# Patient Record
Sex: Male | Born: 1935 | Race: White | Hispanic: No | Smoking: Former smoker
Health system: Southern US, Community
[De-identification: ages and names within clinical notes are randomized; demographics above are authoritative.]

## PROBLEM LIST (undated history)

## (undated) DIAGNOSIS — I10 Essential (primary) hypertension: Secondary | ICD-10-CM

## (undated) DIAGNOSIS — I499 Cardiac arrhythmia, unspecified: Secondary | ICD-10-CM

## (undated) DIAGNOSIS — J439 Emphysema, unspecified: Secondary | ICD-10-CM

## (undated) DIAGNOSIS — N189 Chronic kidney disease, unspecified: Secondary | ICD-10-CM

## (undated) DIAGNOSIS — E785 Hyperlipidemia, unspecified: Secondary | ICD-10-CM

## (undated) DIAGNOSIS — I509 Heart failure, unspecified: Secondary | ICD-10-CM

## (undated) DIAGNOSIS — Z8614 Personal history of Methicillin resistant Staphylococcus aureus infection: Secondary | ICD-10-CM

## (undated) DIAGNOSIS — D696 Thrombocytopenia, unspecified: Secondary | ICD-10-CM

## (undated) DIAGNOSIS — M109 Gout, unspecified: Secondary | ICD-10-CM

## (undated) DIAGNOSIS — I719 Aortic aneurysm of unspecified site, without rupture: Secondary | ICD-10-CM

## (undated) DIAGNOSIS — I2510 Atherosclerotic heart disease of native coronary artery without angina pectoris: Secondary | ICD-10-CM

## (undated) HISTORY — PX: CAROTID ENDARTERECTOMY: SUR193

## (undated) HISTORY — DX: Gout, unspecified: M10.9

## (undated) HISTORY — DX: Personal history of Methicillin resistant Staphylococcus aureus infection: Z86.14

## (undated) HISTORY — DX: Aortic aneurysm of unspecified site, without rupture: I71.9

## (undated) HISTORY — DX: Cardiac arrhythmia, unspecified: I49.9

## (undated) HISTORY — DX: Emphysema, unspecified: J43.9

## (undated) HISTORY — PX: APPENDECTOMY: SHX54

## (undated) HISTORY — PX: HERNIA REPAIR: SHX51

## (undated) HISTORY — DX: Essential (primary) hypertension: I10

## (undated) HISTORY — DX: Thrombocytopenia, unspecified: D69.6

## (undated) HISTORY — DX: Hyperlipidemia, unspecified: E78.5

## (undated) HISTORY — DX: Chronic kidney disease, unspecified: N18.9

## (undated) HISTORY — DX: Atherosclerotic heart disease of native coronary artery without angina pectoris: I25.10

## (undated) HISTORY — PX: ABDOMINAL AORTIC ANEURYSM REPAIR: SUR1152

## (undated) HISTORY — PX: CORONARY ARTERY BYPASS GRAFT: SHX141

---

## 2003-09-19 ENCOUNTER — Other Ambulatory Visit: Payer: Self-pay

## 2003-12-23 ENCOUNTER — Other Ambulatory Visit: Payer: Self-pay

## 2003-12-25 ENCOUNTER — Other Ambulatory Visit: Payer: Self-pay

## 2006-02-20 ENCOUNTER — Emergency Department: Payer: Self-pay | Admitting: Emergency Medicine

## 2006-04-30 ENCOUNTER — Emergency Department: Payer: Self-pay | Admitting: Internal Medicine

## 2007-04-20 ENCOUNTER — Emergency Department: Payer: Self-pay | Admitting: Emergency Medicine

## 2007-09-07 ENCOUNTER — Ambulatory Visit: Payer: Self-pay | Admitting: Internal Medicine

## 2007-09-10 ENCOUNTER — Other Ambulatory Visit: Payer: Self-pay

## 2007-09-10 ENCOUNTER — Emergency Department: Payer: Self-pay | Admitting: Emergency Medicine

## 2007-09-11 ENCOUNTER — Emergency Department: Payer: Self-pay | Admitting: Internal Medicine

## 2008-01-22 ENCOUNTER — Other Ambulatory Visit: Payer: Self-pay

## 2008-01-22 ENCOUNTER — Inpatient Hospital Stay: Payer: Self-pay | Admitting: Internal Medicine

## 2008-07-06 ENCOUNTER — Ambulatory Visit: Payer: Self-pay | Admitting: Oncology

## 2008-07-22 ENCOUNTER — Inpatient Hospital Stay: Payer: Self-pay | Admitting: Internal Medicine

## 2008-08-06 ENCOUNTER — Ambulatory Visit: Payer: Self-pay | Admitting: Oncology

## 2008-08-07 ENCOUNTER — Inpatient Hospital Stay: Payer: Self-pay | Admitting: Internal Medicine

## 2008-10-04 ENCOUNTER — Ambulatory Visit: Payer: Self-pay | Admitting: Family Medicine

## 2008-10-06 ENCOUNTER — Ambulatory Visit: Payer: Self-pay | Admitting: Internal Medicine

## 2008-10-08 ENCOUNTER — Ambulatory Visit: Payer: Self-pay | Admitting: Internal Medicine

## 2008-10-10 ENCOUNTER — Ambulatory Visit: Payer: Self-pay | Admitting: Internal Medicine

## 2008-10-18 ENCOUNTER — Encounter: Payer: Self-pay | Admitting: Internal Medicine

## 2008-10-18 ENCOUNTER — Inpatient Hospital Stay: Payer: Self-pay | Admitting: Orthopedic Surgery

## 2008-12-25 ENCOUNTER — Ambulatory Visit: Payer: Self-pay | Admitting: Family Medicine

## 2009-07-25 ENCOUNTER — Ambulatory Visit: Payer: Self-pay | Admitting: Otolaryngology

## 2009-08-06 ENCOUNTER — Emergency Department: Payer: Self-pay | Admitting: Emergency Medicine

## 2009-08-11 ENCOUNTER — Emergency Department: Payer: Self-pay | Admitting: Internal Medicine

## 2009-08-21 ENCOUNTER — Inpatient Hospital Stay: Payer: Self-pay | Admitting: Internal Medicine

## 2009-12-29 ENCOUNTER — Emergency Department: Payer: Self-pay | Admitting: Emergency Medicine

## 2011-03-28 ENCOUNTER — Inpatient Hospital Stay: Payer: Self-pay | Admitting: Internal Medicine

## 2011-08-23 ENCOUNTER — Inpatient Hospital Stay: Payer: Self-pay | Admitting: Internal Medicine

## 2011-08-23 LAB — COMPREHENSIVE METABOLIC PANEL
Anion Gap: 14 (ref 7–16)
BUN: 40 mg/dL — ABNORMAL HIGH (ref 7–18)
Bilirubin,Total: 0.8 mg/dL (ref 0.2–1.0)
Chloride: 104 mmol/L (ref 98–107)
Co2: 23 mmol/L (ref 21–32)
Creatinine: 2.3 mg/dL — ABNORMAL HIGH (ref 0.60–1.30)
EGFR (African American): 35 — ABNORMAL LOW
EGFR (Non-African Amer.): 29 — ABNORMAL LOW
Osmolality: 293 (ref 275–301)
Potassium: 4.3 mmol/L (ref 3.5–5.1)
SGPT (ALT): 22 U/L
Total Protein: 7.8 g/dL (ref 6.4–8.2)

## 2011-08-23 LAB — CBC
HCT: 43.3 % (ref 40.0–52.0)
HGB: 14.1 g/dL (ref 13.0–18.0)
MCH: 29.7 pg (ref 26.0–34.0)
MCHC: 32.5 g/dL (ref 32.0–36.0)
Platelet: 109 10*3/uL — ABNORMAL LOW (ref 150–440)
RBC: 4.74 10*6/uL (ref 4.40–5.90)
WBC: 5.9 10*3/uL (ref 3.8–10.6)

## 2011-08-23 LAB — TROPONIN I
Troponin-I: 0.05 ng/mL
Troponin-I: 0.13 ng/mL — ABNORMAL HIGH

## 2011-08-23 LAB — CK TOTAL AND CKMB (NOT AT ARMC)
CK, Total: 75 U/L (ref 35–232)
CK-MB: 1.2 ng/mL (ref 0.5–3.6)

## 2011-08-23 LAB — PROTIME-INR
INR: 2.7
Prothrombin Time: 29.2 secs — ABNORMAL HIGH (ref 11.5–14.7)

## 2011-08-24 LAB — CBC WITH DIFFERENTIAL/PLATELET
Basophil #: 0 10*3/uL (ref 0.0–0.1)
Basophil %: 0.2 %
Eosinophil #: 0.1 10*3/uL (ref 0.0–0.7)
Eosinophil %: 0.7 %
HCT: 35.9 % — ABNORMAL LOW (ref 40.0–52.0)
HGB: 11.7 g/dL — ABNORMAL LOW (ref 13.0–18.0)
Lymphocyte %: 10 %
MCH: 30 pg (ref 26.0–34.0)
MCHC: 32.6 g/dL (ref 32.0–36.0)
Monocyte #: 0.8 10*3/uL — ABNORMAL HIGH (ref 0.0–0.7)
Neutrophil #: 10.7 10*3/uL — ABNORMAL HIGH (ref 1.4–6.5)
RDW: 17.3 % — ABNORMAL HIGH (ref 11.5–14.5)

## 2011-08-24 LAB — PROTIME-INR: INR: 2.7

## 2011-08-24 LAB — BASIC METABOLIC PANEL
Anion Gap: 13 (ref 7–16)
BUN: 46 mg/dL — ABNORMAL HIGH (ref 7–18)
Chloride: 97 mmol/L — ABNORMAL LOW (ref 98–107)
Co2: 27 mmol/L (ref 21–32)
EGFR (Non-African Amer.): 23 — ABNORMAL LOW
Potassium: 3.9 mmol/L (ref 3.5–5.1)

## 2011-08-24 LAB — MAGNESIUM: Magnesium: 1.8 mg/dL

## 2011-08-24 LAB — CK TOTAL AND CKMB (NOT AT ARMC): CK, Total: 60 U/L (ref 35–232)

## 2011-08-25 LAB — CBC WITH DIFFERENTIAL/PLATELET
Basophil #: 0 10*3/uL (ref 0.0–0.1)
Basophil %: 0.1 %
Eosinophil #: 0.2 10*3/uL (ref 0.0–0.7)
Eosinophil %: 1.7 %
HCT: 36.7 % — ABNORMAL LOW (ref 40.0–52.0)
HGB: 11.8 g/dL — ABNORMAL LOW (ref 13.0–18.0)
Lymphocyte #: 1.1 10*3/uL (ref 1.0–3.6)
Lymphocyte %: 10.6 %
MCH: 29.7 pg (ref 26.0–34.0)
MCHC: 32.3 g/dL (ref 32.0–36.0)
MCV: 92 fL (ref 80–100)
Monocyte #: 0.7 10*3/uL (ref 0.0–0.7)
Monocyte %: 6.6 %
Neutrophil #: 8.3 10*3/uL — ABNORMAL HIGH (ref 1.4–6.5)
Neutrophil %: 81 %
RBC: 3.99 10*6/uL — ABNORMAL LOW (ref 4.40–5.90)
WBC: 10.3 10*3/uL (ref 3.8–10.6)

## 2011-08-25 LAB — BASIC METABOLIC PANEL
Anion Gap: 10 (ref 7–16)
BUN: 52 mg/dL — ABNORMAL HIGH (ref 7–18)
Chloride: 95 mmol/L — ABNORMAL LOW (ref 98–107)
Co2: 29 mmol/L (ref 21–32)
Creatinine: 2.76 mg/dL — ABNORMAL HIGH (ref 0.60–1.30)
Potassium: 3.6 mmol/L (ref 3.5–5.1)
Sodium: 134 mmol/L — ABNORMAL LOW (ref 136–145)

## 2011-08-26 LAB — CBC WITH DIFFERENTIAL/PLATELET
Basophil %: 0.2 %
Eosinophil #: 0.2 10*3/uL (ref 0.0–0.7)
Eosinophil %: 2 %
HCT: 37 % — ABNORMAL LOW (ref 40.0–52.0)
HGB: 12.1 g/dL — ABNORMAL LOW (ref 13.0–18.0)
Lymphocyte #: 1 10*3/uL (ref 1.0–3.6)
Lymphocyte %: 12 %
MCH: 29.5 pg (ref 26.0–34.0)
MCV: 91 fL (ref 80–100)
Neutrophil #: 6.5 10*3/uL (ref 1.4–6.5)
RBC: 4.09 10*6/uL — ABNORMAL LOW (ref 4.40–5.90)
RDW: 16.8 % — ABNORMAL HIGH (ref 11.5–14.5)
WBC: 8.6 10*3/uL (ref 3.8–10.6)

## 2011-08-26 LAB — BASIC METABOLIC PANEL
Anion Gap: 11 (ref 7–16)
Calcium, Total: 9 mg/dL (ref 8.5–10.1)
Chloride: 94 mmol/L — ABNORMAL LOW (ref 98–107)
Co2: 28 mmol/L (ref 21–32)
Osmolality: 282 (ref 275–301)

## 2011-08-27 LAB — BASIC METABOLIC PANEL
Anion Gap: 11 (ref 7–16)
BUN: 52 mg/dL — ABNORMAL HIGH (ref 7–18)
Calcium, Total: 9.3 mg/dL (ref 8.5–10.1)
Chloride: 94 mmol/L — ABNORMAL LOW (ref 98–107)
Co2: 29 mmol/L (ref 21–32)
Creatinine: 2.54 mg/dL — ABNORMAL HIGH (ref 0.60–1.30)
Creatinine: 2.45 mg/dL — ABNORMAL HIGH (ref 0.60–1.30)
EGFR (African American): 31 — ABNORMAL LOW
EGFR (African American): 33 — ABNORMAL LOW
EGFR (Non-African Amer.): 26 — ABNORMAL LOW
Glucose: 111 mg/dL — ABNORMAL HIGH (ref 65–99)
Osmolality: 277 (ref 275–301)
Osmolality: 283 (ref 275–301)
Potassium: 3.2 mmol/L — ABNORMAL LOW (ref 3.5–5.1)
Potassium: 3.7 mmol/L (ref 3.5–5.1)
Sodium: 131 mmol/L — ABNORMAL LOW (ref 136–145)

## 2011-08-27 LAB — PROTIME-INR
INR: 3.2
Prothrombin Time: 32.9 secs — ABNORMAL HIGH (ref 11.5–14.7)

## 2011-08-27 LAB — MAGNESIUM
Magnesium: 2.3 mg/dL
Magnesium: 2.3 mg/dL

## 2011-08-27 LAB — HEMOGLOBIN: HGB: 13.1 g/dL (ref 13.0–18.0)

## 2011-08-28 LAB — COMPREHENSIVE METABOLIC PANEL
Albumin: 2.4 g/dL — ABNORMAL LOW (ref 3.4–5.0)
Alkaline Phosphatase: 65 U/L (ref 50–136)
Anion Gap: 13 (ref 7–16)
BUN: 44 mg/dL — ABNORMAL HIGH (ref 7–18)
Bilirubin,Total: 0.9 mg/dL (ref 0.2–1.0)
Calcium, Total: 8.1 mg/dL — ABNORMAL LOW (ref 8.5–10.1)
Chloride: 94 mmol/L — ABNORMAL LOW (ref 98–107)
Co2: 31 mmol/L (ref 21–32)
Creatinine: 2.61 mg/dL — ABNORMAL HIGH (ref 0.60–1.30)
Glucose: 156 mg/dL — ABNORMAL HIGH (ref 65–99)
Osmolality: 290 (ref 275–301)
SGOT(AST): 18 U/L (ref 15–37)
SGPT (ALT): 8 U/L — ABNORMAL LOW
Sodium: 138 mmol/L (ref 136–145)
Total Protein: 6.6 g/dL (ref 6.4–8.2)

## 2011-08-28 LAB — CBC WITH DIFFERENTIAL/PLATELET
Basophil %: 0 %
Lymphocyte #: 0.6 10*3/uL — ABNORMAL LOW (ref 1.0–3.6)
Lymphocyte %: 5.3 %
MCHC: 32.9 g/dL (ref 32.0–36.0)
Monocyte #: 0.8 10*3/uL — ABNORMAL HIGH (ref 0.0–0.7)
Monocyte %: 7.5 %
Neutrophil #: 9.4 10*3/uL — ABNORMAL HIGH (ref 1.4–6.5)
Neutrophil %: 87.1 %
Platelet: 108 10*3/uL — ABNORMAL LOW (ref 150–440)
RBC: 3.72 10*6/uL — ABNORMAL LOW (ref 4.40–5.90)
RDW: 16.2 % — ABNORMAL HIGH (ref 11.5–14.5)

## 2011-08-28 LAB — PROTIME-INR
INR: 4.3
INR: 4.7
Prothrombin Time: 43.8 secs — ABNORMAL HIGH (ref 11.5–14.7)

## 2011-08-28 LAB — CULTURE, BLOOD (SINGLE)

## 2011-08-28 LAB — PHOSPHORUS: Phosphorus: 2.8 mg/dL (ref 2.5–4.9)

## 2011-08-28 LAB — APTT: Activated PTT: 86.1 secs — ABNORMAL HIGH (ref 23.6–35.9)

## 2011-08-29 LAB — BASIC METABOLIC PANEL
Anion Gap: 12 (ref 7–16)
Chloride: 97 mmol/L — ABNORMAL LOW (ref 98–107)
Co2: 26 mmol/L (ref 21–32)
Creatinine: 2.21 mg/dL — ABNORMAL HIGH (ref 0.60–1.30)
EGFR (African American): 37 — ABNORMAL LOW
Glucose: 113 mg/dL — ABNORMAL HIGH (ref 65–99)
Osmolality: 281 (ref 275–301)
Potassium: 3.9 mmol/L (ref 3.5–5.1)
Sodium: 135 mmol/L — ABNORMAL LOW (ref 136–145)

## 2011-08-29 LAB — HEMOGLOBIN: HGB: 10.4 g/dL — ABNORMAL LOW (ref 13.0–18.0)

## 2011-08-30 LAB — BASIC METABOLIC PANEL
BUN: 47 mg/dL — ABNORMAL HIGH (ref 7–18)
Chloride: 97 mmol/L — ABNORMAL LOW (ref 98–107)
Creatinine: 2.4 mg/dL — ABNORMAL HIGH (ref 0.60–1.30)
EGFR (African American): 33 — ABNORMAL LOW
EGFR (Non-African Amer.): 27 — ABNORMAL LOW
Glucose: 101 mg/dL — ABNORMAL HIGH (ref 65–99)
Osmolality: 281 (ref 275–301)
Potassium: 3.9 mmol/L (ref 3.5–5.1)
Sodium: 134 mmol/L — ABNORMAL LOW (ref 136–145)

## 2011-08-30 LAB — MAGNESIUM: Magnesium: 2.3 mg/dL

## 2011-08-30 LAB — PROTIME-INR
INR: 3.5
Prothrombin Time: 35.3 secs — ABNORMAL HIGH (ref 11.5–14.7)

## 2011-08-31 LAB — SYNOVIAL CELL COUNT + DIFF, W/ CRYSTALS
Basophil: 0 %
Crystals, Joint Fluid: NONE SEEN
Lymphocytes: 0 %
Neutrophils: 73 %
Nucleated Cell Count: 10980 /mm3
Nucleated Cell Count: 1620 /mm3
Other Cells BF: 0 %
Other Mononuclear Cells: 4 %

## 2011-08-31 LAB — PROTIME-INR
INR: 3.1
Prothrombin Time: 31.8 secs — ABNORMAL HIGH (ref 11.5–14.7)

## 2011-08-31 LAB — BASIC METABOLIC PANEL
Anion Gap: 14 (ref 7–16)
Calcium, Total: 8.5 mg/dL (ref 8.5–10.1)
Co2: 23 mmol/L (ref 21–32)
EGFR (African American): 37 — ABNORMAL LOW
Osmolality: 281 (ref 275–301)

## 2011-08-31 LAB — HEMOGLOBIN: HGB: 9.5 g/dL — ABNORMAL LOW (ref 13.0–18.0)

## 2011-08-31 LAB — MAGNESIUM: Magnesium: 2.4 mg/dL

## 2011-09-01 LAB — PROTIME-INR: Prothrombin Time: 28 secs — ABNORMAL HIGH (ref 11.5–14.7)

## 2011-09-01 LAB — HEMOGLOBIN: HGB: 9.8 g/dL — ABNORMAL LOW (ref 13.0–18.0)

## 2011-12-30 ENCOUNTER — Ambulatory Visit: Payer: Self-pay | Admitting: Vascular Surgery

## 2011-12-30 LAB — CREATININE, SERUM
Creatinine: 3.55 mg/dL — ABNORMAL HIGH (ref 0.60–1.30)
EGFR (African American): 17 — ABNORMAL LOW
EGFR (Non-African Amer.): 15 — ABNORMAL LOW

## 2012-01-04 ENCOUNTER — Ambulatory Visit: Payer: Self-pay | Admitting: Vascular Surgery

## 2012-01-04 LAB — CREATININE, SERUM
Creatinine: 3.37 mg/dL — ABNORMAL HIGH (ref 0.60–1.30)
EGFR (Non-African Amer.): 16 — ABNORMAL LOW

## 2012-01-04 LAB — PROTIME-INR: INR: 2.6

## 2012-01-04 LAB — BUN: BUN: 72 mg/dL — ABNORMAL HIGH (ref 7–18)

## 2012-04-04 ENCOUNTER — Ambulatory Visit: Payer: Self-pay | Admitting: Vascular Surgery

## 2012-04-04 LAB — BASIC METABOLIC PANEL WITH GFR
Anion Gap: 6 — ABNORMAL LOW
BUN: 44 mg/dL — ABNORMAL HIGH
Calcium, Total: 9 mg/dL
Chloride: 104 mmol/L
Co2: 31 mmol/L
Creatinine: 2.42 mg/dL — ABNORMAL HIGH
EGFR (African American): 27 — ABNORMAL LOW
EGFR (Non-African Amer.): 23 — ABNORMAL LOW
Glucose: 102 mg/dL — ABNORMAL HIGH
Osmolality: 293
Potassium: 4.7 mmol/L
Sodium: 141 mmol/L

## 2012-04-04 LAB — CBC
HCT: 38.2 % — ABNORMAL LOW (ref 40.0–52.0)
HGB: 12.7 g/dL — ABNORMAL LOW (ref 13.0–18.0)
MCH: 31.3 pg (ref 26.0–34.0)
MCHC: 33.2 g/dL (ref 32.0–36.0)
RBC: 4.04 10*6/uL — ABNORMAL LOW (ref 4.40–5.90)
WBC: 7.4 10*3/uL (ref 3.8–10.6)

## 2012-04-07 ENCOUNTER — Inpatient Hospital Stay: Payer: Self-pay | Admitting: Vascular Surgery

## 2012-04-07 LAB — PROTIME-INR
INR: 1.5
Prothrombin Time: 18.8 secs — ABNORMAL HIGH (ref 11.5–14.7)

## 2012-04-08 LAB — CBC WITH DIFFERENTIAL/PLATELET
Basophil %: 0.5 %
Eosinophil %: 3.1 %
HCT: 36.2 % — ABNORMAL LOW (ref 40.0–52.0)
Lymphocyte %: 10.1 %
MCHC: 32.4 g/dL (ref 32.0–36.0)
Monocyte #: 0.7 x10 3/mm (ref 0.2–1.0)
Monocyte %: 12.1 %
Neutrophil #: 4.3 10*3/uL (ref 1.4–6.5)
Neutrophil %: 74.2 %
RBC: 3.84 10*6/uL — ABNORMAL LOW (ref 4.40–5.90)
RDW: 16.3 % — ABNORMAL HIGH (ref 11.5–14.5)
WBC: 5.8 10*3/uL (ref 3.8–10.6)

## 2012-04-08 LAB — BASIC METABOLIC PANEL
Anion Gap: 6 — ABNORMAL LOW (ref 7–16)
BUN: 36 mg/dL — ABNORMAL HIGH (ref 7–18)
Calcium, Total: 8.2 mg/dL — ABNORMAL LOW (ref 8.5–10.1)
Chloride: 105 mmol/L (ref 98–107)
EGFR (African American): 29 — ABNORMAL LOW
EGFR (Non-African Amer.): 25 — ABNORMAL LOW
Glucose: 97 mg/dL (ref 65–99)
Osmolality: 288 (ref 275–301)

## 2012-04-08 LAB — PROTIME-INR
INR: 1.5
Prothrombin Time: 18.4 secs — ABNORMAL HIGH (ref 11.5–14.7)

## 2012-04-08 LAB — APTT: Activated PTT: 41.1 secs — ABNORMAL HIGH (ref 23.6–35.9)

## 2013-03-12 ENCOUNTER — Emergency Department: Payer: Self-pay | Admitting: Emergency Medicine

## 2013-03-12 LAB — BASIC METABOLIC PANEL
Anion Gap: 3 — ABNORMAL LOW (ref 7–16)
BUN: 27 mg/dL — ABNORMAL HIGH (ref 7–18)
Calcium, Total: 8.9 mg/dL (ref 8.5–10.1)
Co2: 28 mmol/L (ref 21–32)
Creatinine: 2.2 mg/dL — ABNORMAL HIGH (ref 0.60–1.30)
EGFR (African American): 30 — ABNORMAL LOW
Glucose: 103 mg/dL — ABNORMAL HIGH (ref 65–99)
Osmolality: 281 (ref 275–301)

## 2013-03-12 LAB — CBC
HCT: 38 % — ABNORMAL LOW (ref 40.0–52.0)
HGB: 12.8 g/dL — ABNORMAL LOW (ref 13.0–18.0)
MCH: 30.7 pg (ref 26.0–34.0)
MCHC: 33.6 g/dL (ref 32.0–36.0)
MCV: 92 fL (ref 80–100)
Platelet: 98 10*3/uL — ABNORMAL LOW (ref 150–440)
RBC: 4.15 10*6/uL — ABNORMAL LOW (ref 4.40–5.90)
WBC: 6.7 10*3/uL (ref 3.8–10.6)

## 2013-03-12 LAB — TROPONIN I: Troponin-I: 0.07 ng/mL — ABNORMAL HIGH

## 2013-09-25 ENCOUNTER — Ambulatory Visit: Payer: Self-pay | Admitting: Pain Medicine

## 2013-10-03 ENCOUNTER — Ambulatory Visit: Payer: Self-pay | Admitting: Pain Medicine

## 2013-10-03 LAB — HEPATIC FUNCTION PANEL A (ARMC)
ALK PHOS: 120 U/L — AB
Albumin: 3.9 g/dL (ref 3.4–5.0)
BILIRUBIN TOTAL: 1 mg/dL (ref 0.2–1.0)
Bilirubin, Direct: 0.2 mg/dL (ref 0.00–0.20)
SGOT(AST): 20 U/L (ref 15–37)
SGPT (ALT): 15 U/L (ref 12–78)
TOTAL PROTEIN: 7.8 g/dL (ref 6.4–8.2)

## 2013-10-03 LAB — BASIC METABOLIC PANEL
Anion Gap: 3 — ABNORMAL LOW (ref 7–16)
BUN: 44 mg/dL — AB (ref 7–18)
CALCIUM: 8.7 mg/dL (ref 8.5–10.1)
CO2: 30 mmol/L (ref 21–32)
Chloride: 105 mmol/L (ref 98–107)
Creatinine: 3.07 mg/dL — ABNORMAL HIGH (ref 0.60–1.30)
EGFR (Non-African Amer.): 17 — ABNORMAL LOW
GFR CALC AF AMER: 20 — AB
GLUCOSE: 100 mg/dL — AB (ref 65–99)
Osmolality: 287 (ref 275–301)
POTASSIUM: 4.2 mmol/L (ref 3.5–5.1)
SODIUM: 138 mmol/L (ref 136–145)

## 2013-10-03 LAB — SEDIMENTATION RATE: ERYTHROCYTE SED RATE: 34 mm/h — AB (ref 0–20)

## 2013-10-17 ENCOUNTER — Ambulatory Visit: Payer: Self-pay | Admitting: Pain Medicine

## 2013-11-06 ENCOUNTER — Ambulatory Visit: Payer: Self-pay | Admitting: Pain Medicine

## 2013-11-16 ENCOUNTER — Ambulatory Visit: Payer: Self-pay | Admitting: Pain Medicine

## 2013-11-17 ENCOUNTER — Ambulatory Visit: Payer: Self-pay | Admitting: Pain Medicine

## 2013-12-11 ENCOUNTER — Ambulatory Visit: Payer: Self-pay | Admitting: Pain Medicine

## 2013-12-19 ENCOUNTER — Ambulatory Visit: Payer: Self-pay | Admitting: Pain Medicine

## 2014-01-03 ENCOUNTER — Ambulatory Visit: Payer: Self-pay | Admitting: Pain Medicine

## 2014-01-11 ENCOUNTER — Ambulatory Visit: Payer: Self-pay | Admitting: Pain Medicine

## 2014-01-18 ENCOUNTER — Ambulatory Visit: Payer: Self-pay | Admitting: Pain Medicine

## 2014-01-31 ENCOUNTER — Ambulatory Visit: Payer: Self-pay | Admitting: Pain Medicine

## 2014-02-06 ENCOUNTER — Ambulatory Visit: Payer: Self-pay | Admitting: Pain Medicine

## 2014-02-26 ENCOUNTER — Ambulatory Visit: Payer: Self-pay | Admitting: Pain Medicine

## 2014-02-28 ENCOUNTER — Other Ambulatory Visit: Payer: Self-pay | Admitting: Pain Medicine

## 2014-02-28 LAB — BASIC METABOLIC PANEL
ANION GAP: 5 — AB (ref 7–16)
BUN: 39 mg/dL — ABNORMAL HIGH (ref 7–18)
CO2: 29 mmol/L (ref 21–32)
Calcium, Total: 8.6 mg/dL (ref 8.5–10.1)
Chloride: 108 mmol/L — ABNORMAL HIGH (ref 98–107)
Creatinine: 2.44 mg/dL — ABNORMAL HIGH (ref 0.60–1.30)
GFR CALC AF AMER: 27 — AB
GFR CALC NON AF AMER: 23 — AB
GLUCOSE: 100 mg/dL — AB (ref 65–99)
Osmolality: 293 (ref 275–301)
Potassium: 4.3 mmol/L (ref 3.5–5.1)
Sodium: 142 mmol/L (ref 136–145)

## 2014-02-28 LAB — HEPATIC FUNCTION PANEL A (ARMC)
ALK PHOS: 106 U/L
ALT: 16 U/L
Albumin: 3.4 g/dL (ref 3.4–5.0)
Bilirubin, Direct: 0.1 mg/dL (ref 0.00–0.20)
Bilirubin,Total: 0.6 mg/dL (ref 0.2–1.0)
SGOT(AST): 14 U/L — ABNORMAL LOW (ref 15–37)
Total Protein: 7.5 g/dL (ref 6.4–8.2)

## 2014-03-07 ENCOUNTER — Ambulatory Visit: Payer: Self-pay | Admitting: Pain Medicine

## 2014-03-21 ENCOUNTER — Ambulatory Visit: Payer: Self-pay | Admitting: Pain Medicine

## 2014-03-21 ENCOUNTER — Other Ambulatory Visit: Payer: Self-pay | Admitting: Pain Medicine

## 2014-03-21 LAB — URINALYSIS, COMPLETE
Bilirubin,UR: NEGATIVE
GLUCOSE, UR: NEGATIVE mg/dL (ref 0–75)
Hyaline Cast: 3
KETONE: NEGATIVE
Nitrite: NEGATIVE
PH: 6 (ref 4.5–8.0)
PROTEIN: NEGATIVE
RBC,UR: 6 /HPF (ref 0–5)
SQUAMOUS EPITHELIAL: NONE SEEN
Specific Gravity: 1.006 (ref 1.003–1.030)
WBC UR: 443 /HPF (ref 0–5)

## 2014-03-28 ENCOUNTER — Ambulatory Visit: Payer: Self-pay | Admitting: Internal Medicine

## 2014-04-07 ENCOUNTER — Emergency Department: Payer: Self-pay | Admitting: Internal Medicine

## 2014-04-07 LAB — COMPREHENSIVE METABOLIC PANEL
ALK PHOS: 83 U/L
Albumin: 3.1 g/dL — ABNORMAL LOW (ref 3.4–5.0)
Anion Gap: 6 — ABNORMAL LOW (ref 7–16)
BILIRUBIN TOTAL: 0.6 mg/dL (ref 0.2–1.0)
BUN: 39 mg/dL — ABNORMAL HIGH (ref 7–18)
CALCIUM: 8.3 mg/dL — AB (ref 8.5–10.1)
Chloride: 107 mmol/L (ref 98–107)
Co2: 29 mmol/L (ref 21–32)
Creatinine: 2.46 mg/dL — ABNORMAL HIGH (ref 0.60–1.30)
EGFR (African American): 32 — ABNORMAL LOW
EGFR (Non-African Amer.): 27 — ABNORMAL LOW
Glucose: 104 mg/dL — ABNORMAL HIGH (ref 65–99)
OSMOLALITY: 293 (ref 275–301)
Potassium: 4.2 mmol/L (ref 3.5–5.1)
SGOT(AST): 20 U/L (ref 15–37)
SGPT (ALT): 16 U/L
Sodium: 142 mmol/L (ref 136–145)
Total Protein: 6.7 g/dL (ref 6.4–8.2)

## 2014-04-07 LAB — CBC
HCT: 29.2 % — ABNORMAL LOW (ref 40.0–52.0)
HGB: 8.9 g/dL — ABNORMAL LOW (ref 13.0–18.0)
MCH: 26.7 pg (ref 26.0–34.0)
MCHC: 30.3 g/dL — AB (ref 32.0–36.0)
MCV: 88 fL (ref 80–100)
PLATELETS: 111 10*3/uL — AB (ref 150–440)
RBC: 3.32 10*6/uL — ABNORMAL LOW (ref 4.40–5.90)
RDW: 17.7 % — ABNORMAL HIGH (ref 11.5–14.5)
WBC: 6 10*3/uL (ref 3.8–10.6)

## 2014-04-07 LAB — PROTIME-INR
INR: 2.3
Prothrombin Time: 24.4 secs — ABNORMAL HIGH (ref 11.5–14.7)

## 2014-04-07 LAB — TROPONIN I: TROPONIN-I: 0.05 ng/mL

## 2014-04-07 LAB — LIPASE, BLOOD: LIPASE: 251 U/L (ref 73–393)

## 2014-04-07 LAB — CK TOTAL AND CKMB (NOT AT ARMC)
CK, TOTAL: 40 U/L
CK-MB: 2.2 ng/mL (ref 0.5–3.6)

## 2014-04-07 LAB — PRO B NATRIURETIC PEPTIDE: B-TYPE NATIURETIC PEPTID: 13722 pg/mL — AB (ref 0–450)

## 2014-04-13 ENCOUNTER — Ambulatory Visit: Payer: Self-pay | Admitting: Pain Medicine

## 2014-07-24 ENCOUNTER — Inpatient Hospital Stay: Payer: Self-pay | Admitting: Internal Medicine

## 2014-07-24 LAB — COMPREHENSIVE METABOLIC PANEL
ALT: 16 U/L
ANION GAP: 7 (ref 7–16)
AST: 21 U/L (ref 15–37)
Albumin: 3.2 g/dL — ABNORMAL LOW (ref 3.4–5.0)
Alkaline Phosphatase: 96 U/L
BUN: 26 mg/dL — ABNORMAL HIGH (ref 7–18)
Bilirubin,Total: 1.1 mg/dL — ABNORMAL HIGH (ref 0.2–1.0)
CALCIUM: 8.8 mg/dL (ref 8.5–10.1)
Chloride: 108 mmol/L — ABNORMAL HIGH (ref 98–107)
Co2: 26 mmol/L (ref 21–32)
Creatinine: 2.52 mg/dL — ABNORMAL HIGH (ref 0.60–1.30)
GFR CALC AF AMER: 31 — AB
GFR CALC NON AF AMER: 26 — AB
Glucose: 95 mg/dL (ref 65–99)
Osmolality: 286 (ref 275–301)
POTASSIUM: 4.2 mmol/L (ref 3.5–5.1)
Sodium: 141 mmol/L (ref 136–145)
TOTAL PROTEIN: 7 g/dL (ref 6.4–8.2)

## 2014-07-24 LAB — CBC
HCT: 26.4 % — AB (ref 40.0–52.0)
HGB: 7.9 g/dL — AB (ref 13.0–18.0)
MCH: 23.2 pg — AB (ref 26.0–34.0)
MCHC: 29.8 g/dL — ABNORMAL LOW (ref 32.0–36.0)
MCV: 78 fL — AB (ref 80–100)
Platelet: 99 10*3/uL — ABNORMAL LOW (ref 150–440)
RBC: 3.39 10*6/uL — ABNORMAL LOW (ref 4.40–5.90)
RDW: 19.3 % — AB (ref 11.5–14.5)
WBC: 4.8 10*3/uL (ref 3.8–10.6)

## 2014-07-24 LAB — TROPONIN I
Troponin-I: 0.1 ng/mL — ABNORMAL HIGH
Troponin-I: 0.1 ng/mL — ABNORMAL HIGH
Troponin-I: 0.13 ng/mL — ABNORMAL HIGH

## 2014-07-25 LAB — CBC WITH DIFFERENTIAL/PLATELET
BASOS ABS: 0 10*3/uL (ref 0.0–0.1)
Basophil %: 0.4 %
Eosinophil #: 0.5 10*3/uL (ref 0.0–0.7)
Eosinophil %: 9.3 %
HCT: 26.9 % — ABNORMAL LOW (ref 40.0–52.0)
HGB: 8 g/dL — AB (ref 13.0–18.0)
LYMPHS PCT: 16.3 %
Lymphocyte #: 0.8 10*3/uL — ABNORMAL LOW (ref 1.0–3.6)
MCH: 23 pg — ABNORMAL LOW (ref 26.0–34.0)
MCHC: 29.8 g/dL — ABNORMAL LOW (ref 32.0–36.0)
MCV: 77 fL — ABNORMAL LOW (ref 80–100)
Monocyte #: 0.8 x10 3/mm (ref 0.2–1.0)
Monocyte %: 16.3 %
Neutrophil #: 2.9 10*3/uL (ref 1.4–6.5)
Neutrophil %: 57.7 %
Platelet: 96 10*3/uL — ABNORMAL LOW (ref 150–440)
RBC: 3.48 10*6/uL — ABNORMAL LOW (ref 4.40–5.90)
RDW: 19.3 % — AB (ref 11.5–14.5)
WBC: 5 10*3/uL (ref 3.8–10.6)

## 2014-07-25 LAB — BASIC METABOLIC PANEL
ANION GAP: 8 (ref 7–16)
BUN: 32 mg/dL — AB (ref 7–18)
CHLORIDE: 104 mmol/L (ref 98–107)
CREATININE: 2.64 mg/dL — AB (ref 0.60–1.30)
Calcium, Total: 8.6 mg/dL (ref 8.5–10.1)
Co2: 27 mmol/L (ref 21–32)
EGFR (Non-African Amer.): 24 — ABNORMAL LOW
GFR CALC AF AMER: 30 — AB
GLUCOSE: 89 mg/dL (ref 65–99)
Osmolality: 284 (ref 275–301)
Potassium: 4.5 mmol/L (ref 3.5–5.1)
Sodium: 139 mmol/L (ref 136–145)

## 2014-07-26 LAB — BASIC METABOLIC PANEL
ANION GAP: 8 (ref 7–16)
BUN: 34 mg/dL — AB (ref 7–18)
Calcium, Total: 8.2 mg/dL — ABNORMAL LOW (ref 8.5–10.1)
Chloride: 103 mmol/L (ref 98–107)
Co2: 28 mmol/L (ref 21–32)
Creatinine: 2.79 mg/dL — ABNORMAL HIGH (ref 0.60–1.30)
EGFR (Non-African Amer.): 23 — ABNORMAL LOW
GFR CALC AF AMER: 28 — AB
GLUCOSE: 91 mg/dL (ref 65–99)
Osmolality: 285 (ref 275–301)
Potassium: 4 mmol/L (ref 3.5–5.1)
Sodium: 139 mmol/L (ref 136–145)

## 2014-08-15 ENCOUNTER — Emergency Department: Payer: Self-pay | Admitting: Emergency Medicine

## 2014-08-21 ENCOUNTER — Emergency Department: Payer: Self-pay | Admitting: Emergency Medicine

## 2014-09-28 ENCOUNTER — Emergency Department: Payer: Self-pay | Admitting: Emergency Medicine

## 2014-09-28 LAB — BASIC METABOLIC PANEL
Anion Gap: 9 (ref 7–16)
BUN: 39 mg/dL — ABNORMAL HIGH
CALCIUM: 9 mg/dL
CHLORIDE: 103 mmol/L
CREATININE: 2.82 mg/dL — AB
Co2: 27 mmol/L
EGFR (African American): 22 — ABNORMAL LOW
EGFR (Non-African Amer.): 19 — ABNORMAL LOW
Glucose: 113 mg/dL — ABNORMAL HIGH
POTASSIUM: 4.1 mmol/L
Sodium: 139 mmol/L

## 2014-09-28 LAB — CBC WITH DIFFERENTIAL/PLATELET
Basophil #: 0 10*3/uL (ref 0.0–0.1)
Basophil %: 0.4 %
EOS ABS: 0.3 10*3/uL (ref 0.0–0.7)
Eosinophil %: 4.4 %
HCT: 28.7 % — ABNORMAL LOW (ref 40.0–52.0)
HGB: 8.2 g/dL — ABNORMAL LOW (ref 13.0–18.0)
Lymphocyte #: 0.7 10*3/uL — ABNORMAL LOW (ref 1.0–3.6)
Lymphocyte %: 11.9 %
MCH: 20.7 pg — ABNORMAL LOW (ref 26.0–34.0)
MCHC: 28.7 g/dL — AB (ref 32.0–36.0)
MCV: 72 fL — AB (ref 80–100)
MONO ABS: 0.8 x10 3/mm (ref 0.2–1.0)
Monocyte %: 12.4 %
NEUTROS PCT: 70.9 %
Neutrophil #: 4.4 10*3/uL (ref 1.4–6.5)
Platelet: 117 10*3/uL — ABNORMAL LOW (ref 150–440)
RBC: 3.98 10*6/uL — ABNORMAL LOW (ref 4.40–5.90)
RDW: 20.7 % — ABNORMAL HIGH (ref 11.5–14.5)
WBC: 6.2 10*3/uL (ref 3.8–10.6)

## 2014-09-28 LAB — TROPONIN I
TROPONIN-I: 0.06 ng/mL — AB
Troponin-I: 0.05 ng/mL — ABNORMAL HIGH

## 2014-10-05 ENCOUNTER — Ambulatory Visit
Admit: 2014-10-05 | Disposition: A | Discharge status: Home / Self Care | Payer: Self-pay | Attending: Family | Admitting: Family

## 2014-10-09 DIAGNOSIS — I5022 Chronic systolic (congestive) heart failure: Secondary | ICD-10-CM | POA: Insufficient documentation

## 2014-10-15 LAB — COMPREHENSIVE METABOLIC PANEL
ALK PHOS: 91 U/L
ALT: 16 U/L — AB
AST: 38 U/L
Albumin: 3.4 g/dL — ABNORMAL LOW
Anion Gap: 11 (ref 7–16)
BUN: 49 mg/dL — AB
Bilirubin,Total: 1.9 mg/dL — ABNORMAL HIGH
Calcium, Total: 8.4 mg/dL — ABNORMAL LOW
Chloride: 102 mmol/L
Co2: 23 mmol/L
Creatinine: 3.18 mg/dL — ABNORMAL HIGH
GFR CALC AF AMER: 19 — AB
GFR CALC NON AF AMER: 17 — AB
Glucose: 116 mg/dL — ABNORMAL HIGH
POTASSIUM: 4.3 mmol/L
Sodium: 136 mmol/L
TOTAL PROTEIN: 6.7 g/dL

## 2014-10-15 LAB — CBC WITH DIFFERENTIAL/PLATELET
Basophil #: 0 10*3/uL (ref 0.0–0.1)
Basophil %: 0.1 %
Eosinophil #: 0 10*3/uL (ref 0.0–0.7)
Eosinophil %: 0 %
HCT: 28.8 % — ABNORMAL LOW (ref 40.0–52.0)
HGB: 8.3 g/dL — ABNORMAL LOW (ref 13.0–18.0)
Lymphocyte #: 0.7 10*3/uL — ABNORMAL LOW (ref 1.0–3.6)
Lymphocyte %: 5.2 %
MCH: 20.8 pg — AB (ref 26.0–34.0)
MCHC: 28.7 g/dL — AB (ref 32.0–36.0)
MCV: 73 fL — ABNORMAL LOW (ref 80–100)
MONO ABS: 0.9 x10 3/mm (ref 0.2–1.0)
Monocyte %: 7 %
NEUTROS ABS: 11.1 10*3/uL — AB (ref 1.4–6.5)
NEUTROS PCT: 87.7 %
Platelet: 113 10*3/uL — ABNORMAL LOW (ref 150–440)
RBC: 3.96 10*6/uL — ABNORMAL LOW (ref 4.40–5.90)
RDW: 21.2 % — AB (ref 11.5–14.5)
WBC: 12.7 10*3/uL — ABNORMAL HIGH (ref 3.8–10.6)

## 2014-10-15 LAB — TROPONIN I: TROPONIN-I: 0.35 ng/mL — AB

## 2014-10-15 LAB — PROTIME-INR
INR: 1.4
Prothrombin Time: 17 secs — ABNORMAL HIGH

## 2014-10-15 LAB — LACTIC ACID, PLASMA: LACTIC ACID, VENOUS: 3.6 mmol/L — AB

## 2014-10-15 LAB — MAGNESIUM: Magnesium: 2 mg/dL

## 2014-10-15 LAB — PHOSPHORUS: Phosphorus: 3.2 mg/dL

## 2014-10-16 ENCOUNTER — Inpatient Hospital Stay
Admit: 2014-10-16 | Disposition: A | Discharge status: Skilled Nursing Facility | Payer: Self-pay | Attending: Internal Medicine | Admitting: Internal Medicine

## 2014-10-16 LAB — CBC WITH DIFFERENTIAL/PLATELET
Basophil #: 0 10*3/uL (ref 0.0–0.1)
Basophil %: 0.1 %
EOS ABS: 0 10*3/uL (ref 0.0–0.7)
EOS PCT: 0 %
HCT: 29.6 % — ABNORMAL LOW (ref 40.0–52.0)
HGB: 8.4 g/dL — ABNORMAL LOW (ref 13.0–18.0)
LYMPHS PCT: 4 %
Lymphocyte #: 0.6 10*3/uL — ABNORMAL LOW (ref 1.0–3.6)
MCH: 20.6 pg — AB (ref 26.0–34.0)
MCHC: 28.4 g/dL — ABNORMAL LOW (ref 32.0–36.0)
MCV: 73 fL — ABNORMAL LOW (ref 80–100)
MONO ABS: 1.1 x10 3/mm — AB (ref 0.2–1.0)
Monocyte %: 7.6 %
Neutrophil #: 13.1 10*3/uL — ABNORMAL HIGH (ref 1.4–6.5)
Neutrophil %: 88.3 %
PLATELETS: 97 10*3/uL — AB (ref 150–440)
RBC: 4.07 10*6/uL — ABNORMAL LOW (ref 4.40–5.90)
RDW: 21.2 % — ABNORMAL HIGH (ref 11.5–14.5)
WBC: 14.8 10*3/uL — ABNORMAL HIGH (ref 3.8–10.6)

## 2014-10-16 LAB — BASIC METABOLIC PANEL
ANION GAP: 8 (ref 7–16)
BUN: 49 mg/dL — ABNORMAL HIGH
CO2: 24 mmol/L
Calcium, Total: 7.9 mg/dL — ABNORMAL LOW
Chloride: 103 mmol/L
Creatinine: 3.17 mg/dL — ABNORMAL HIGH
EGFR (African American): 19 — ABNORMAL LOW
EGFR (Non-African Amer.): 17 — ABNORMAL LOW
GLUCOSE: 132 mg/dL — AB
Potassium: 5.4 mmol/L — ABNORMAL HIGH
Sodium: 135 mmol/L

## 2014-10-16 LAB — IRON AND TIBC
IRON BIND. CAP.(TOTAL): 221 — AB (ref 250–450)
Iron Saturation: 3.2
Iron: 7 ug/dL — ABNORMAL LOW
Unbound Iron-Bind.Cap.: 213.8

## 2014-10-16 LAB — CK-MB
CK-MB: 3.6 ng/mL
CK-MB: 20.9 ng/mL — AB
CK-MB: 21.1 ng/mL — AB

## 2014-10-16 LAB — URINALYSIS, COMPLETE
Bacteria: NONE SEEN
Bilirubin,UR: NEGATIVE
Blood: NEGATIVE
Glucose,UR: NEGATIVE mg/dL (ref 0–75)
Ketone: NEGATIVE
Leukocyte Esterase: NEGATIVE
Nitrite: NEGATIVE
Ph: 5 (ref 4.5–8.0)
Protein: NEGATIVE
Specific Gravity: 1.009 (ref 1.003–1.030)
Squamous Epithelial: NONE SEEN
WBC UR: NONE SEEN /HPF (ref 0–5)

## 2014-10-16 LAB — LACTIC ACID, PLASMA: LACTIC ACID, VENOUS: 3.3 mmol/L — AB

## 2014-10-16 LAB — TROPONIN I
Troponin-I: 4.22 ng/mL — ABNORMAL HIGH
Troponin-I: 5.89 ng/mL — ABNORMAL HIGH

## 2014-10-16 LAB — FERRITIN: Ferritin (ARMC): 35 ng/mL

## 2014-10-17 LAB — CBC WITH DIFFERENTIAL/PLATELET
BASOS ABS: 0.1 10*3/uL (ref 0.0–0.1)
Basophil %: 0.4 %
EOS PCT: 0.2 %
Eosinophil #: 0 10*3/uL (ref 0.0–0.7)
HCT: 24.7 % — ABNORMAL LOW (ref 40.0–52.0)
HGB: 7.3 g/dL — ABNORMAL LOW (ref 13.0–18.0)
LYMPHS ABS: 0.7 10*3/uL — AB (ref 1.0–3.6)
LYMPHS PCT: 5.9 %
MCH: 21 pg — ABNORMAL LOW (ref 26.0–34.0)
MCHC: 29.5 g/dL — ABNORMAL LOW (ref 32.0–36.0)
MCV: 71 fL — AB (ref 80–100)
MONOS PCT: 6.6 %
Monocyte #: 0.8 x10 3/mm (ref 0.2–1.0)
NEUTROS ABS: 10.6 10*3/uL — AB (ref 1.4–6.5)
Neutrophil %: 86.9 %
Platelet: 92 10*3/uL — ABNORMAL LOW (ref 150–440)
RBC: 3.47 10*6/uL — AB (ref 4.40–5.90)
RDW: 20.8 % — ABNORMAL HIGH (ref 11.5–14.5)
WBC: 12.2 10*3/uL — ABNORMAL HIGH (ref 3.8–10.6)

## 2014-10-17 LAB — COMPREHENSIVE METABOLIC PANEL
ALBUMIN: 2.9 g/dL — AB
ALK PHOS: 72 U/L
ANION GAP: 9 (ref 7–16)
BUN: 58 mg/dL — ABNORMAL HIGH
Bilirubin,Total: 2.2 mg/dL — ABNORMAL HIGH
CREATININE: 3.31 mg/dL — AB
Calcium, Total: 8.1 mg/dL — ABNORMAL LOW
Chloride: 102 mmol/L
Co2: 22 mmol/L
EGFR (African American): 18 — ABNORMAL LOW
EGFR (Non-African Amer.): 16 — ABNORMAL LOW
Glucose: 132 mg/dL — ABNORMAL HIGH
POTASSIUM: 5.1 mmol/L
SGOT(AST): 48 U/L — ABNORMAL HIGH
SGPT (ALT): 21 U/L
Sodium: 133 mmol/L — ABNORMAL LOW
Total Protein: 6 g/dL — ABNORMAL LOW

## 2014-10-17 LAB — URINE CULTURE

## 2014-10-17 LAB — CLOSTRIDIUM DIFFICILE(ARMC)

## 2014-10-18 LAB — EXPECTORATED SPUTUM ASSESSMENT W GRAM STAIN, RFLX TO RESP C

## 2014-10-18 LAB — BASIC METABOLIC PANEL
Anion Gap: 8 (ref 7–16)
BUN: 57 mg/dL — ABNORMAL HIGH
CO2: 23 mmol/L
CREATININE: 2.84 mg/dL — AB
Calcium, Total: 8 mg/dL — ABNORMAL LOW
Chloride: 103 mmol/L
GFR CALC AF AMER: 22 — AB
GFR CALC NON AF AMER: 19 — AB
GLUCOSE: 109 mg/dL — AB
POTASSIUM: 4.2 mmol/L
Sodium: 134 mmol/L — ABNORMAL LOW

## 2014-10-18 LAB — CBC WITH DIFFERENTIAL/PLATELET
BASOS ABS: 0 10*3/uL (ref 0.0–0.1)
Basophil %: 0.2 %
EOS PCT: 1.8 %
Eosinophil #: 0.1 10*3/uL (ref 0.0–0.7)
HCT: 26.2 % — ABNORMAL LOW (ref 40.0–52.0)
HGB: 7.9 g/dL — ABNORMAL LOW (ref 13.0–18.0)
LYMPHS ABS: 0.5 10*3/uL — AB (ref 1.0–3.6)
Lymphocyte %: 6.5 %
MCH: 21.8 pg — ABNORMAL LOW (ref 26.0–34.0)
MCHC: 30.2 g/dL — ABNORMAL LOW (ref 32.0–36.0)
MCV: 72 fL — ABNORMAL LOW (ref 80–100)
Monocyte #: 0.6 x10 3/mm (ref 0.2–1.0)
Monocyte %: 8 %
NEUTROS ABS: 6.6 10*3/uL — AB (ref 1.4–6.5)
Neutrophil %: 83.5 %
Platelet: 70 10*3/uL — ABNORMAL LOW (ref 150–440)
RBC: 3.63 10*6/uL — ABNORMAL LOW (ref 4.40–5.90)
RDW: 21.4 % — AB (ref 11.5–14.5)
WBC: 7.8 10*3/uL (ref 3.8–10.6)

## 2014-10-19 LAB — BASIC METABOLIC PANEL
Anion Gap: 8 (ref 7–16)
BUN: 55 mg/dL — AB
CREATININE: 2.88 mg/dL — AB
Calcium, Total: 8.1 mg/dL — ABNORMAL LOW
Chloride: 102 mmol/L
Co2: 22 mmol/L
EGFR (African American): 22 — ABNORMAL LOW
GFR CALC NON AF AMER: 19 — AB
Glucose: 106 mg/dL — ABNORMAL HIGH
Potassium: 3.9 mmol/L
Sodium: 132 mmol/L — ABNORMAL LOW

## 2014-10-20 LAB — CULTURE, BLOOD (SINGLE)

## 2014-10-23 NOTE — Op Note (Signed)
PATIENT NAME:  Chad [name], Chad [name] MR#:  [id number] DATE OF BIRTH:  01/07/1924  DATE OF PROCEDURE:  04/07/2012  PREOPERATIVE DIAGNOSES:  1. High-grade right carotid artery stenosis.  2. Hypertension.  3. Abdominal aortic aneurysm.  4. History of heart disease.  5. Chronic kidney disease.  6. Gout.   POSTOPERATIVE DIAGNOSES:  1. High-grade right carotid artery stenosis.  2. Hypertension.  3. Abdominal aortic aneurysm.  4. History of heart disease.  5. Chronic kidney disease.  6. Gout.   PROCEDURES: Right carotid endarterectomy.   SURGEON: Annice Needy, MD   ANESTHESIA: General.   ESTIMATED BLOOD LOSS: Approximately 50 mL.   INDICATION FOR PROCEDURE: This is an 81 year old gentleman who has a high-grade right carotid artery stenosis in the 90+% range. He has previously undergone abdominal aortic aneurysm repair within the last 12 months without difficulty and has had a lengthy preoperative evaluation including renal, cardiac and pulmonary evaluation prior to his surgery. When they felt he was clear for surgery we proceeded. The risks and benefits were discussed. Informed consent was obtained.   DESCRIPTION OF PROCEDURE: The patient was brought to the operative suite and after an adequate level of general anesthesia was obtained, a roll was placed under  the shoulders. The head was flexed as best as possible, and he was turned and placed in modified beach chair position. His neck was then sterilely prepped and draped and a sterile surgical field was created. An incision was created along the anterior border of the sternocleidomastoid, and we dissected down through platysma with electrocautery. The sternocleidomastoid was retracted laterally with the help of Weitlaner retractors. The artery was then identified and dissected free. It was encircled with vessel loops on the common, internal and external carotid arteries as well as the superior thyroid artery. The internal carotid artery was  dissected out several centimeters beyond the lesion, which was in the proximal internal carotid artery. The patient was systemically heparinized with 5000 units of intravenous after systemic anticoagulation. Control was pulled up on the vessel loops. An anterior wall arteriotomy was created with a #11 blade and extended with Potts scissors. The Pruitt-Inahara shunt was placed first in the internal carotid artery, flushed and de-aired, then the common carotid artery. Approximately 90 seconds elapsed between clamping and shunt placement. I then performed an endarterectomy in standard fashion with the [facility]. The proximal endpoint was cut flush with tenotomy scissors and eversion endarterectomy was performed of the external carotid artery, and a nice feathered distal endpoint was created with blunt traction removing the distal endpoint.  Three 7-0 Prolene tacking sutures were used to tack down the distal endpoint. Due to the very generous size of the artery, a primary closure was planned. The vessel was locally heparinized and a 6-0 Prolene was started at the distal endpoint and run one-half the length of the arteriotomy. A second 6-0 Prolene was started at the proximal endpoint and run one-quarter length of the arteriotomy. The shunt was then removed. The vessel was flushed from the external, common and internal carotid arteries, and then the arteriotomy was closed, flushing through the external carotid artery several cycles before release of control. Less than two minutes elapsed from shunt removal to restoration of blood flow to the internal carotid artery. Two 6-0 Prolene patch sutures were used for hemostasis, and hemostasis was complete. Surgicel and Evicyl were placed. After irrigation, the wound was closed with 3 interrupted 3-0 Vicryl sutures in the sternocleidomastoid space. The platysma was closed with a  running 3-0 Vicryl, and the skin was closed with 4-0 Monocryl. Dermabond was placed as a  dressing. The patient tolerated the procedure well and was taken to the recovery room in stable condition.  ____________________________ Annice NeedyJason S. Ranier Coach, MD jsd:cbb D: 04/07/2012 15:35:20 ET T: 04/07/2012 16:00:49 ET JOB#: [id number]  cc: Annice NeedyJason S. Birl Lobello, MD, <Dictator> [name], MD Annice NeedyJASON S Cap Massi MD ELECTRONICALLY SIGNED 04/11/2012 11:18

## 2014-10-28 NOTE — H&P (Signed)
PATIENT NAME:  Chad [name], Chad [name] MR#:  [id number] DATE OF BIRTH:  30-Jan-1924  DATE OF ADMISSION:  08/23/2011  REFERRING PHYSICIAN: Dr. Dolores FrameSung PRIMARY CARE PHYSICIAN: Dr. Candelaria Stagershaplin PRIMARY CARDIOLOGIST: Dr. Gwen PoundsKowalski  PRESENTING COMPLAINT: Chills and shortness of breath at rest.   HISTORY OF PRESENT ILLNESS: Mr. Chad [name] is an 81 year old gentleman history of cardiomyopathy with ejection fraction of 35%, valvular heart disease, atrial fibrillation, coronary artery disease status post bypass, chronic kidney disease, abdominal aortic aneurysm who presents with reports of developing chills last night with continued symptoms until his arrival here to the Emergency Room. Also endorses shortness of breath at rest but not on exertion. No orthopnea or PND. Has not felt any palpitations. He is aware when he goes into atrial fibrillation. No presyncope or syncope.   PAST MEDICAL HISTORY:  1. Last admitted 09/22 to 04/09/2011 for management of cholecystitis and acute pancreatitis status post cholecystectomy. Hospital course complicated by gouty flare and acute on chronic respiratory failure in the setting of congestive heart failure.  2. Cardiomyopathy with ejection fraction of 35%. Echo from 03/2011 with moderate to severe mitral insufficiency, moderate tricuspid insufficiency, mild pulmonary hypertension.  3. Coronary artery disease status post bypass in May 2000, LIMA to LAD, saphenous vein graft to 1D, saphenous vein graft to RCA and saphenous vein graft to ramus.  4. Atrial fibrillation and atrial flutter, on Coumadin.  5. Valvular heart disease.  6. Gout.  7. Chronic kidney disease stage III, baseline around 2 to 2.3.  8. Deafness.  9. Chronic thrombocytopenia.  10. Abdominal aortic aneurysm incidentally found on admission in September 2012.  11. Hyperlipidemia.  12. Emphysema.  13. Inflammatory arthritis.  14. Septic arthritis and MRSA.  15. Anxiety.   MEDICATIONS:  1. Hydralazine 10 mg t.i.d.   2. Atorvastatin 20 mg daily.  3. Imdur 30 mg daily.  4. Torsemide 20 mg daily.  5. Allopurinol 400 mg daily.  6. Colchicine 0.6 mg 1 tablet every three days.  7. Advair Diskus 100/50 b.i.d.  8. Xopenex nebulizer every eight hours as needed.  9. Metoprolol tartrate 50 mg b.i.d.  10. Coumadin 2 mg at bedtime.   ALLERGIES: No known drug allergies.   PAST SURGICAL HISTORY:  1. Coronary artery bypass graft.  2. Appendectomy.  3. Cholecystectomy.  4. Hernia repair.   FAMILY HISTORY: Mother died of colon cancer at 981. Father died of heart disease and stroke at 2671.   SOCIAL HISTORY: Quit smoking in 2007, used to smoke [name] pack per day. No alcohol or drug use. He lives in [place] alone. Ambulates with [name] walker.   REVIEW OF SYSTEMS: CONSTITUTIONAL: Denies any fevers. Endorses chills. No nausea or vomiting. EYES: No changes in vision. ENT: Reports epistaxis few days ago. No dysphagia currently. RESPIRATORY: No cough, wheezing, hemoptysis. Endorses shortness of breath at rest. CARDIOVASCULAR: No chest pain, orthopnea, lower extremity edema. Denies any weight gain. No palpitations or syncope. GASTROINTESTINAL: No nausea, vomiting, diarrhea, abdominal pain, hematemesis. GENITOURINARY: No dysuria, hematuria. ENDO: No polyuria, polydipsia. HEMATOLOGIC: No easy bleeding. SKIN: No ulcers. MUSCULOSKELETAL: No neck or back pain. NEUROLOGIC: No one-sided weakness or numbness. PSYCH: Denies any depression. He endorses anxiety.    PHYSICAL EXAMINATION:  VITAL SIGNS: Pulse 84, respiratory rate 26, blood pressure 114/73, 87% on room air, currently 100% on BiPAP.   GENERAL: Lying in bed in no apparent distress.   HEENT: Normocephalic, atraumatic. Pupils equal, symmetric. BiPAP in place. Slightly dry mucous membrane.   NECK: Soft and supple. No adenopathy.  Has elevated JVP of approximately 8 cm.   CARDIOVASCULAR: Non-tachy. No murmurs, rubs, or gallops.   LUNGS: Loud transmitted breath sounds. No use of  accessory muscles or increased respiratory effort. No wheezing.   ABDOMEN: Soft. Positive bowel sounds. No mass appreciated.   EXTREMITIES: Trace edema bilaterally. Dorsal pedis pulses intact.   MUSCULOSKELETAL: No joint effusion, warmth or erythema.   NEUROLOGICAL: Symmetrical strength. No focal deficits.   PSYCH: He is alert and oriented. Patient is cooperative.   LABORATORY, DIAGNOSTIC AND RADIOLOGICAL DATA: ABG on BiPAP with pH 7.42, pCO2 39, pO2 167. Glucose 127, BUN 40, creatinine 2.3, sodium 141, potassium 4.3, chloride 104, carbon dioxide 23, calcium 8.4. LFTs within normal limits. WBC 5.9, hemoglobin 14.1, hematocrit 43.3, platelets 109, MCV 91, troponin 0.05. BNP 3619, INR 2.7. EKG with sinus rate of 82 and right bundle branch block. No ST changes. Chest x-ray with pulmonary edema. Official chest x-ray reading is pending.   ASSESSMENT AND PLAN: [name] is [name] pleasant 81 year old gentleman with history of cardiomyopathy ejection fraction of 35%, valvular heart disease, emphysema, atrial fibrillation, abdominal aortic aneurysm, coronary artery disease status post bypass, chronic kidney disease, thrombocytopenia, deafness, hypertension presenting from home with complaints of chills, shortness of breath at rest.  1. Acute on chronic respiratory failure in the setting of pulmonary edema, acute on chronic systolic dysfunction, congestive heart failure. Echocardiogram results as above. Oxygen sats improved on BiPAP. Will try to convert to nasal cannula and continue to follow symptoms. Relative hypotension likely in the setting of diuresis and BiPAP. Follow closely with restarting metoprolol, hydralazine, Imdur and Lasix IV. Lowest blood pressure reading with systolic 82 with diastolic 59. Will restart metoprolol, hydralazine, Imdur and continue Lasix IV. Continue to follow closely. Will currently hold torsemide. Obtain cardiology consultation for medication adjustments and recommendations.  Continue to follow ins and outs and daily weights. Patient reports that he has not been gaining any weight and he actually has been checking his weight daily. He is currently in normal sinus rhythm. Continue on tele and cycle cardiac enzymes.  2. Chronic kidney disease, baseline creatinine again at baseline but monitor closely with diuresis.  3. Atrial fibrillation. Last discharge he was taken off of the Coumadin but it looks like he has been restarted. His acquired coagulopathy is likely in the setting of Coumadin. Continue to follow INR as currently therapeutic and again currently normal sinus rhythm. Continue on tele.  4. Abdominal aortic aneurysm. Patient was instructed to follow up with vascular on last discharge, unsure what the plans are for surgery.  5. Prophylaxis. On Coumadin and Protonix.  6. Thrombocytopenia. Stable.   TIME SPENT: Approximately 50 minutes spent on patient care.   ____________________________ Reuel Derby, MD ap:cms D: 08/23/2011 06:54:02 ET T: 08/23/2011 08:29:35 ET JOB#: [id number]  cc: Jesstin Studstill, MD, <Dictator> Don C. Candelaria Stagers, MD Reuel Derby MD ELECTRONICALLY SIGNED 09/01/2011 1:20

## 2014-10-28 NOTE — Consult Note (Signed)
Brief Consult Note: Patient was seen by consultant.   Consult note dictated.   Comments: polyarticular gout.both knees aspirated and injected. if no better with colchicine ,consider short course of steriods.  Electronic Signatures: Royann ShiversKernodle, Jr., Helen HashimotoGeorge Wallace (MD)  (Signed [id number]-Feb-13 19:39)  Authored: Brief Consult Note   Last Updated: 25-Feb-13 19:39 by Royann ShiversKernodle, Jr., Helen HashimotoGeorge Wallace (MD)

## 2014-10-28 NOTE — Op Note (Signed)
PATIENT NAME:  Chad CorneaSHOCKLEY, Alphonsus A MR#:  [id number] DATE OF BIRTH:  Dec 26, 1923  DATE OF PROCEDURE:  01/04/2012  PREOPERATIVE DIAGNOSES:  1. High-grade right carotid artery stenosis by duplex.  2. Abdominal aortic aneurysm, status post repair.  3. Chronic obstructive pulmonary disease.  4. Chronic renal insufficiency precluding CT angiogram.   POSTOPERATIVE DIAGNOSES:  1. High-grade right carotid artery stenosis by duplex.  2. Abdominal aortic aneurysm, status post repair.  3. Chronic obstructive pulmonary disease.  4. Chronic renal insufficiency precluding CT angiogram.   PROCEDURE:  1. Ultrasound guidance for vascular access, left femoral artery.  2. Catheter placement into innominate artery from right femoral approach.  3. Thoracic aortogram and selective cervical and cerebral right carotid arteriogram.  4. StarClose closure device, left femoral artery.   SURGEON: Annice NeedyJason S. Darold Miley, M.D.   ANESTHESIA: Local with moderate conscious sedation.   ESTIMATED BLOOD LOSS: Minimal.   FLUOROSCOPY TIME: 2.4 minutes.   CONTRAST USED: 36 mL.   INDICATION FOR PROCEDURE: This is an 81 year old gentleman with multiple vascular medical issues. He had a large aneurysm fixed within the last year and has had carotid artery stenosis. He has now recovered from his aneurysm. His baseline of functioning and carotid velocity is increased on his most recent duplex. This now appears quite high grade on ultrasound. CT angiogram could not be performed due to his chronic renal insufficiency so he was brought in for a diagnostic carotid arteriogram with limited contrast. Risks and benefits were discussed. Informed consent was obtained.   DESCRIPTION OF PROCEDURE: The patient was brought to the vascular interventional radiology suite. Groins were shaved and prepped and a sterile surgical field was created. Due to him be anticoagulated and having previous aneurysm repair, ultrasound was used to access the left femoral  artery as this was the percutaneously accessed side previously. This was done under direct ultrasound guidance without difficulty with a Seldinger needle. J-wire and 5 French sheath were placed. A pigtail catheter was placed into the ascending aorta and LAO projection thoracic aortogram was performed. This showed a significant reverse curve type III arch with no flow-limiting stenosis in the proximal great vessels. Both subclavian arteries appeared somewhat ectatic but had good flow. I then used a Simmons I catheter to cannulate the innominate artery on the right and cervical and cerebral carotid arteriography was performed on the right side. This demonstrated a greater than 90% stenosis in the internal carotid artery about 1 cm beyond the bifurcation in the carotid artery. The remainder of the intracerebral flow was normal and the distal internal carotid artery was normal in caliber.  LAO, RAO, and lateral projections were used to evaluate the carotid stenosis. At this point I elected to terminate the procedure. The diagnostic catheter was removed. Oblique arteriogram was performed in the left femoral artery and StarClose closure device was deployed in the usual fashion with excellent hemostatic result. The patient tolerated the procedure well and was taken to the recovery room in stable condition.     ____________________________ Annice NeedyJason S. Kristen Fromm, MD jsd:bjt D: 01/04/2012 11:18:57 ET T: 01/04/2012 11:28:10 ET JOB#: [id number]  cc: Annice NeedyJason S. Alyanna Stoermer, MD, <Dictator> [name], MD Annice NeedyJASON S Ethanjames Fontenot MD ELECTRONICALLY SIGNED 01/04/2012 13:27

## 2014-10-28 NOTE — Discharge Summary (Signed)
PATIENT NAME:  Chad [name], Chad [name] MR#:  [id number] DATE OF BIRTH:  April 01, 1924  DATE OF ADMISSION:  08/23/2011 DATE OF DISCHARGE:  09/01/2011   DISCHARGE SUMMARY UPDATE  [name] was seen by the rheumatologist Dr. Saverio DankerWallace Kernodle. Both knees and elbows were injected with corticosteroids for his acute gout flare with some moderate improvement in his symptoms. He was seen again by the speech therapist because of some issues of dysphagia. She addressed the issue of his medicines to be crushed and he was to have carbonated drink and sit upright and all those good modalities to promote better swallowing. He remained cardiovascularly stable. His hemoglobin remained stable. He was offered rehab options and he preferred to go home with Home Health.   VITAL SIGNS: He was afebrile. Heart rates around 70. Blood pressure 149/86. He required 1.5 liters of nasal prongs to maintain 94% saturation.   LABORATORY STUDIES AT DISCHARGE: Hemoglobin 9.8, which was stable. INR was 2.6. His warfarin is being held. His urinalysis was unremarkable.   DISCHARGE MEDICATIONS: 1. Tylenol 325 to 650 as needed for mild pain and temperature. 2. Imdur 30 mg daily. 3. Allopurinol has been reduced to 100 mg daily. Apparently he was taking 400 on admission.  4. Norco 5/325, 1 to 2 tablets every 4 to 6 hours for moderate pain.  5. Torsemide 20 mg daily for his hypertension and chronic renal disease promoting diuresis. Lopressor 50 mg b.i.d. for his atrial fibrillation.  6. Advair 100/50, 1 puff twice [name] day for chronic obstructive pulmonary disease.  7. Alprazolam 10 mg 3 times daily for his cardiomyopathy and blood pressure control. 8. Colchicine 0.6 mg daily. 9. He is to resume his atorvastatin, Lipitor, 20 mg daily.  10. He is to hold his warfarin for 72 hours and then restart 2 mg daily.   Patient was originally going home with home health; after further evaluation, he/family  was elected to go to [name].  In the past his proTime has been monitored by his cardiologist. He will have an appointment for followup by Dr. Wyn Quakerew in regards to his aortic aneurysm repair.   PROGNOSIS: Overall prognosis remains guarded.   ADD TO DISCHARGE DIAGNOSES:  Chronic gout with an acute flare.   ____________________________ [name], MD dcc:bjt D: 09/01/2011 08:12:55 ET T: 09/01/2011 08:56:43 ET JOB#: [id number]  cc: Jessamyn Watterson C. Candelaria Stagershaplin, MD, <Dictator> Virl AxeN C Jasmarie Coppock MD ELECTRONICALLY SIGNED 09/01/2011 17:27

## 2014-10-28 NOTE — Consult Note (Signed)
PATIENT NAME:  Chad [name], EDMONDSON MR#:  [id number] DATE OF BIRTH:  Mar 06, 1924  DATE OF CONSULTATION:  08/24/2011  REFERRING PHYSICIAN:  Jimmie Molly. Candelaria Stagers, MD CONSULTING PHYSICIAN:  Annice Needy, MD  REASON FOR CONSULTATION: A 6.6 cm abdominal aortic aneurysm.   HISTORY OF PRESENT ILLNESS: The patient is an 81 year old white male who was admitted with worsening shortness of breath and chills. He has multiple medical issues, including cardiomyopathy with an ejection fraction of 35%, chronic obstructive pulmonary disease, and had previously been known to have an abdominal aortic aneurysm. This has apparently grown some over time, and he was lost to follow-up to the physician he had previously seen for his aneurysm. This has now changed and has grown, and as he is in the hospital and medically optimized, Dr. Candelaria Stagers feels that if this is ever going to be repaired now would be the time. He does not have shearing back pain or abdominal pain. He has no signs of peripheral embolization. The shortness breath is better than at the time of admission.   PAST MEDICAL HISTORY:  1. Cardiomyopathy with 35% ejection fraction.  2. Coronary artery disease.  3. Atrial fibrillation.  4. Gout.  5. Chronic kidney disease with baseline creatinine of approximately 2.5.  6. Deafness. 7. Pancreatitis.  8. Hyperlipidemia.  9. Chronic obstructive pulmonary disease.  10. Abdominal aortic aneurysm.  11. Anxiety.   PAST SURGICAL HISTORY:  1. Cholecystectomy  2. Coronary artery bypass graft. 3. Hernia repair.  4. Appendectomy.   ALLERGIES: None known.  HOME MEDICATIONS:   1. Hydralazine 10 mg t.i.d.  2. Atorvastatin 20 mg daily.  3. Imdur 30 mg daily.  4. Torsemide 20 mg daily.  5. Allopurinol 400 mg daily.  6. Colchicine 0.6 mg, 1 tablet every 3 days.  7. Advair Diskus 100/50 b.i.d.  8. Xopenex nebulizers every 8 hours as needed.  9. Metoprolol 50 mg b.i.d.  10. Coumadin 2 mg at bedtime.   SOCIAL HISTORY: He  was a heavy smoker for many years, quitting approximately 5 years ago. No alcohol or drug use.   FAMILY HISTORY: Mother died of colon cancer in her 56s. Father died of heart disease in his 72s.   REVIEW OF SYSTEMS: CONSTITUTIONAL: He had chills on admission, none currently. No fevers. No intentional weight loss or gain. EYES: No blurred or double vision. EARS: Deafness. RESPIRATORY: Positive for shortness of breath even at rest. CARDIAC: No current chest pains or palpitations. GASTROINTESTINAL: No nausea, vomiting, diarrhea. GENITOURINARY: No dysuria or hematuria. Positive for chronic kidney disease. ENDOCRINE: No heat or cold intolerance. HEMATOLOGIC:  Positive for thrombocytopenia which is chronic. SKIN: No new rashes or ulcers. MUSCULOSKELETAL: No joint pain or swelling. NEUROLOGICAL: No transient ischemic attack, stroke or seizure. PSYCHIATRIC: No anxiety or depression currently.   PHYSICAL EXAMINATION:  GENERAL: This is an elderly white male lying quietly in his bed.   VITAL SIGNS: Temperature 97.6, pulse 63, blood pressure 117/64, saturations are 93% on 2 liters nasal cannula.   HEENT: Head: Normocephalic and atraumatic. Eyes: Sclerae are anicteric. Conjunctivae are clear. Ears: He is deaf. He does read lips some, and his speech is altered due to his deafness.   NECK: Supple without adenopathy or jugular venous distention. Bilateral carotid bruits are present.  HEART: Irregularly irregular with a systolic murmur.   LUNGS: Diminished with expiratory wheezes bilaterally.   ABDOMEN: Soft, nondistended, nontender. Easily palpable abdominal aortic aneurysm which is nontender.   EXTREMITIES: Warm and well perfused with mild  edema. He has 1+ pedal pulses bilaterally.   NEUROLOGICAL: Normal strength and tone in all four extremities. Cranial nerves are intact with the exception of deafness.   SKIN: Warm and dry.   LABORATORY, DIAGNOSTIC AND RADIOLOGICAL DATA:  Sodium 137, potassium 3.9,  chloride 97, CO2 27, BUN is 46, creatinine is 2.75 which is a clearance of 23, glucose is 98.  White blood cell count is 12.8, hemoglobin is 11.7, platelet count is 73,000.  INR is 2.7.   ASSESSMENT AND PLAN: The patient is an 81 year old white male with a large abdominal aortic aneurysm seen on ultrasound. He is a very high-risk surgical candidate and is not a candidate for open aneurysm repair, in my opinion. I will evaluate him for a stent graft, and we will actually try to do this with an uninfused CT due to his severe chronic kidney disease. I will order the CT and return to see the patient following the CT to see if he is a candidate for repair. This is a large aneurysm with a high risk of rupture, and leaving it alone is certainly a potential cause for mortality in this patient. This is a complex situation. I will follow along and assist in his management.   This is a level-5 consultation.  ____________________________ Annice NeedyJason S. Dew, MD jsd:cbb D: 09/16/2011 10:39:14 ET T: 09/16/2011 13:04:25 ET JOB#: [id number]  cc: Annice NeedyJason S. Dew, MD, <Dictator> Annice NeedyJASON S DEW MD ELECTRONICALLY SIGNED 09/18/2011 16:24

## 2014-10-28 NOTE — Discharge Summary (Signed)
PATIENT NAME:  Chad [name], Chad [name] MR#:  [id number] DATE OF BIRTH:  Nov 05, 1923  DATE OF ADMISSION:  08/23/2011 DATE OF DISCHARGE:  08/31/2011  HISTORY/HOSPITAL COURSE: Mr. Lucchese is an 81 year old male with known history of cardiomyopathy, chronic atrial fibrillation, valvular cardiomyopathy, coronary artery disease post bypass, chronic kidney disease, and abdominal aortic aneurysm followed by Dr. Nehemiah Massed as his primary cardiologist who also monitors his anticoagulant who was admitted with shortness of breath and questionable fever. It was felt that he had acute on chronic respiratory failure at the time of admission with evidence of acute on chronic systolic congestive heart failure. He required BiPAP support while he was diuresed. He responded fairly quickly and was seen by his cardiologist early on during the hospitalization. His abdominal aneurysm was felt to be deteriorating based on a CAT scan and ultrasound and he was seen in consultation by Dr. Leotis Pain. He had further consultations with the nephrologist because of his acute on chronic renal failure and was seen by Dr. Raul Del for his acute on chronic respiratory failure. His multisystem disease was maximized and he underwent a percutaneous aortic aneurysm repair by Dr. Lucky Cowboy successfully. The patient's postoperative course was complicated with what appeared to be acute gout, multiple joint issues. He also had some blood loss. The actual procedure required less than 25% in the contrast dye and he was prerenally prepped prior to the procedure, which he tolerated well.   DISCHARGE DIAGNOSES:  1. Acute on chronic congestive heart failure. 2. Acute on chronic respiratory failure.  3. Acute on chronic renal failure. 4. Aortic aneurysm repair. 5. Atrial fibrillation. 6. Deaf and communicates with sign language and writing.   LABS/STUDIES: At the time of admission, his sugar was 98, BUN 46, creatinine 2.75, sodium 137, potassium 3.9, and chloride 97.  eGFR was 23. At the time of dictation, 08/31/2011, sugar was 119, BUN 51, creatinine 2.18, sodium 133, potassium 4.6, and chloride 95. eGFR was 31.     Uric acid was 5.8. Magnesium was 2.4. Hemoglobin was 9.5. INR was 3.1. Blood pressure was running around 127/75 to 136/81, temperature 98, and pulses were running around 80.  ____________________________ Dianah Field. Mable Fill, MD dcc:slb D: 08/31/2011 08:21:27 ET T: 08/31/2011 09:08:14 ET JOB#: [id number]  cc: Dianah Field. Mable Fill, MD, <Dictator> Tawni Millers MD ELECTRONICALLY SIGNED 09/01/2011 17:28

## 2014-10-28 NOTE — Consult Note (Signed)
Present Illness 81 year old male with known severe dilated cardiomyopathy with diffuse coronary artery disease, hypertension, hyperlipidemia, chronic atrial fibrillation, was down appropriate medications.  Over the last many months without evidence of significant concerns of new congestive heart failure.  The patient has some deafness which significantly changes.  His ability for compliance.  Previously he is been on anticoagulation for his atrial fibrillation and has not had any recent stroke issues.  He has had appropriate medications for his dilated cardiomyopathy but has had new onset of shortness of breath, pulmonary edema and respiratory failure.  This is multifactorial in nature, including the possibility of some renal insufficiency, LV systolic dysfunction as well as some COPD with hypoxia.  The patient has had some improvements of this with intravenous Lasix.  Heart rate has been well-controlled with cardiomyopathy control with metoprolol.  The patient also is on hydralazine and isosorbide due to the fact that renal insufficiency is a concern.  The patient does have a large abdominal aortic aneurysm which may need further treatment.  He has had of an EKG showing normal sinus rhythm with right bundle branch block.  No current evidence of acute myocardial infarction  Family history No family members with early onset of cardiovascular disease  Social history The patient currently denies alcohol or tobacco use   Physical Exam:   GEN WD    HEENT pink conjunctivae    NECK No masses    RESP wheezing  crackles    CARD Irregular rate and rhythm  Murmur    Murmur Systolic    Systolic Murmur axilla    ABD denies tenderness  no hernia  no Abdominal Bruits    LYMPH negative neck    EXTR negative cyanosis/clubbing, positive edema    SKIN No rashes    NEURO cranial nerves intact    PSYCH alert   Review of Systems:   Subjective/Chief Complaint patient has shortness of breath     Respiratory: Short of breath    Review of Systems: All other systems were reviewed and found to be negative    Medications/Allergies Reviewed Medications/Allergies reviewed     EF 15% 02/2011:    High Cholesterol:    Renal Failure:    HTN:    Emphysema:    CHF:    Deaf:    gout:    CABG (Coronary Artery Bypass Graft):   Home Medications: Medication Instructions Status  Advair Diskus 100 mcg-50 mcg inhalation powder 1 puff(s) inhaled 2 times a day Active  Xopenex 0.63 mg/3 mL inhalation solution 3 milliliter(s) inhaled every 8 hours may be switched to as needed as lung condition improves Active  allopurinol 100 mg oral tablet 4 tab(s) orally once a day Active  colchicine 0.6 mg oral tablet 1 tab(s) orally every 3 days Active  hydrALAZINE 10 mg oral tablet 1 tab(s) orally 3 times a day Active  warfarin 2 mg oral tablet 1 tab(s) orally once a day Active  torsemide 20 mg oral tablet 1 tab(s) orally once a day Active  Lipitor 20 mg oral tablet 1 tab(s) orally once a day (at bedtime) Active  metoprolol tartrate 50 mg oral tablet 1 tab(s) orally 2 times a day Active  isosorbide mononitrate 30 mg oral tablet, extended release 1 tab(s) orally once a day (in the morning) Active   Cardiology:  17-Feb-13 02:26    Ventricular Rate 82   Atrial Rate 82   P-R Interval 198   QRS Duration 124   QT  424   QTc 495   P Axis 78   R Axis -37   T Axis 18  Routine Chem:  17-Feb-13 02:29    Glucose, Serum 127   BUN 40   Creatinine (comp) 2.30   Sodium, Serum 141   Potassium, Serum 4.3   Chloride, Serum 104   CO2, Serum 23   Calcium (Total), Serum 8.4  Hepatic:  17-Feb-13 02:29    Bilirubin, Total 0.8   Alkaline Phosphatase 91   SGPT (ALT) 22   SGOT (AST) 36   Total Protein, Serum 7.8   Albumin, Serum 3.7  Routine Chem:  17-Feb-13 02:29    Osmolality (calc) 293   eGFR (African American) 35   eGFR (Non-African American) 29   Anion Gap 14  Routine Hem:  17-Feb-13 02:29     WBC (CBC) 5.9   RBC (CBC) 4.74   Hemoglobin (CBC) 14.1   Hematocrit (CBC) 43.3   Platelet Count (CBC) 109   MCV 91   MCH 29.7   MCHC 32.5   RDW 17.0  Cardiac:  17-Feb-13 02:29    Troponin I 0.05  Routine Chem:  17-Feb-13 02:29    B-Type Natriuretic Peptide [facility]) 3619  Routine Coag:  17-Feb-13 02:29    Prothrombin 29.2   INR 2.7  Cardiac:  17-Feb-13 02:29    CK, Total 60   CPK-MB, Serum 1.2  Lab:  17-Feb-13 03:10    pH (ABG) 7.42   PCO2 39   PO2 167   FiO2 100   Base Excess 0.9   HCO3 25.3   O2 Saturation 99   O2 Device BIPAP   Specimen Type (ABG) ARTERIAL   PSV 10   CPAP 5.0   Mechanical Rate 8  Cardiac:  17-Feb-13 10:25    Troponin I 0.18   CK, Total 62   CPK-MB, Serum 1.2    18:56    Troponin I 0.13   CK, Total 75   CPK-MB, Serum 1.6  Routine Chem:  18-Feb-13 04:00    Glucose, Serum 98   BUN 46   Creatinine (comp) 2.75   Sodium, Serum 137   Potassium, Serum 3.9   Chloride, Serum 97   CO2, Serum 27   Calcium (Total), Serum 8.4   Osmolality (calc) 286   eGFR (African American) 28   eGFR (Non-African American) 23   Anion Gap 13  Routine Hem:  18-Feb-13 04:00    WBC (CBC) 12.8   RBC (CBC) 3.90   Hemoglobin (CBC) 11.7   Hematocrit (CBC) 35.9   Platelet Count (CBC) 73   MCV 92   MCH 30.0   MCHC 32.6   RDW 17.3  Routine Coag:  18-Feb-13 04:00    Prothrombin 29.2   INR 2.7  Routine Hem:  18-Feb-13 04:00    Neutrophil % 83.2   Lymphocyte % 10.0   Monocyte % 5.9   Eosinophil % 0.7   Basophil % 0.2   Neutrophil # 10.7   Lymphocyte # 1.3   Monocyte # 0.8   Eosinophil # 0.1   Basophil # 0.0  Routine Chem:  18-Feb-13 04:00    Magnesium, Serum 1.8  19-Feb-13 06:37    Glucose, Serum 98   BUN 52   Creatinine (comp) 2.76   Sodium, Serum 134   Potassium, Serum 3.6   Chloride, Serum 95   CO2, Serum 29   Calcium (Total), Serum 8.6   Osmolality (calc) 282   eGFR (African American) 28  eGFR (Non-African American) 23   Anion Gap 10   Routine Hem:  19-Feb-13 06:37    WBC (CBC) 10.3   RBC (CBC) 3.99   Hemoglobin (CBC) 11.8   Hematocrit (CBC) 36.7   Platelet Count (CBC) 88   MCV 92   MCH 29.7   MCHC 32.3   RDW 17.4  Routine Chem:  19-Feb-13 06:37    B-Type Natriuretic Peptide [facility]) 6624  Routine Hem:  19-Feb-13 06:37    Neutrophil % 81.0   Lymphocyte % 10.6   Monocyte % 6.6   Eosinophil % 1.7   Basophil % 0.1   Neutrophil # 8.3   Lymphocyte # 1.1   Monocyte # 0.7   Eosinophil # 0.2   Basophil # 0.0   EKG:   EKG Interp. by me    Interpretation normal sinus rhythm with right bundle branch block    No Known Allergies:   Vital Signs/Nurse's Notes: **Vital Signs.:   19-Feb-13 05:36   Vital Signs Type Routine   Temperature Temperature (F) 99   Celsius 37.2   Temperature Source oral   Pulse Pulse 70   Pulse source per Dinamap   Respirations Respirations 20   Systolic BP Systolic BP 263   Diastolic BP (mmHg) Diastolic BP (mmHg) 85   Mean BP 104   BP Source Dinamap   Pulse Ox % Pulse Ox % 93   Pulse Ox Activity Level  At rest   Oxygen Delivery 2L     Impression 81 year old male with known acute on chronic congestive heart failure with severe LV systolic dysfunction, valvular heart disease, hypertension, hyperlipidemia, chronic atrial fibrillation intermittently in normal range.  Her rhythm at this time with a significant abdominal aortic aneurysm needing further treatment    Plan 1.  Continue intravenous Lasix and switched to oral Lasix as able for further treatment of pulmonary edema 2.  Continue current medical regimen for heart rate control.  Cardiomyopathy and hypertension control without change 3.  No further cardiac diagnostics necessary at this time due to reasonable stability of congestive heart failure, cardiomyopathy and valvular heart disease without evidence of acute myocardial infarction during this admission. 4.  No use of ACE inhibitor due to concerns of significant renal  insufficiency and aortic abdominal aneurysm. 5.  Patient is at moderate risk for cardiovascular complication with abdominal aortic aneurysm stent grafting but reasonable stability.  At this time.  If patient willing to proceed. 6.  Further treatment and adjustments of medications as necessary depending on above   Electronic Signatures: Corey Skains (MD)  (Signed 19-Feb-13 12:45)  Authored: General Aspect/Present Illness, History and Physical Exam, Review of System, Past Medical History, Home Medications, Labs, EKG , Allergies, Vital Signs/Nurse's Notes, Impression/Plan   Last Updated: 19-Feb-13 12:45 by Corey Skains (MD)

## 2014-10-28 NOTE — Op Note (Signed)
PATIENT NAME:  Chad [name], Chad [name] MR#:  [id number] DATE OF BIRTH:  [birth year]  DATE OF PROCEDURE:  08/27/2011  PREOPERATIVE DIAGNOSES: 1. Abdominal aortic aneurysm, 6.7 cm.   2. Chronic respiratory failure.  3. Chronic renal insufficiency with baseline creatinine clearance in the 20s.  4. Hypertension.   POSTOPERATIVE DIAGNOSES:  1. Abdominal aortic aneurysm, 6.7 cm.   2. Chronic respiratory failure.  3. Chronic renal insufficiency with baseline creatinine clearance in the 20s.  4. Hypertension.   PROCEDURES PERFORMED: 1. Catheter placement into left femoral artery from right femoral approach.  2. Catheter placement into aorta from left femoral approach.  3. Aortogram and iliofemoral arteriogram.  4. Placement of an Endologix Power Link Unibody endoprosthesis with a 25 mm diameter main body and 20 mm iliac limbs.  5. Placement of an aortic extension cuff, 28 mm diameter x 75 mm length, covered.  6. StarClose closure device of left femoral artery,  7.  Open surgical cutdown, right femoral artery.   SURGEON: Chad [name], M.D.   ANESTHESIA: General.   ESTIMATED BLOOD LOSS: Approximately 25 mL.  FLUOROSCOPY TIME: 10 minutes.  CONTRAST USED:  25 mL Visipaque.  INDICATION FOR PROCEDURE: This is an 81 year old white male with multiple medical comorbidities. He was admitted to the hospital with respiratory insufficiency. He was found to have enlargement of a previously known abdominal aortic aneurysm, which is now 6.7 cm in maximal diameter. His chronic kidney disease precluded an infused scan. This was seen on an uninfused scan. We discussed with him options for repair versus observation. He had had significant growth over the last five months of approximately 4 to 5 mm in diameter and his primary care physician basically said that he was a medically ready for repair as he could be while he was in the hospital. As such we offered the patient an endovascular repair with the Endologix  endoprosthesis to potentially limit contrast. We discussed the risks including renal failure, access related complications, cardiorespiratory complications, endoleaks and other issues, and he desired to proceed.   DESCRIPTION OF PROCEDURE: The patient was brought to the vascular interventional radiology suite with the help of our anesthesia colleagues providing a general anesthetic. His abdomen and groins were sterilely prepped and draped and a sterile surgical field was created. An incision was created overlying the right femoral artery and the right femoral artery was dissected out and encircled with vessel loops. On the left, the left femoral artery was accessed percutaneously without difficulty. A J-wire and 8 French sheath were placed. The patient was not systemically heparinized, as his INR was greater 2, from his chronic Coumadin therapy.  We placed the large sheath up on the right, which went without difficulty, over a Lunderquist wire. We then snared the glidewire using a snare catheter on the left and pulled this catheter, the glidewire, and the contralateral limb catheter, and pulled this out the left femoral sheath. The glidewire was removed and an 0.014 was replaced. The main body was parked on the aortic bifurcation and deployed in the usual fashion. The contralateral limb was deployed over the 0.014 wire. We then exchanged for an 0.035 wire and a pigtail catheter. An aortogram was performed at this point using a pigtail catheter up from the left delineating the location of the renal arteries. Based on this, a 28 mm diameter proximal, 75 mm covered length aortic extension cuff was selected, deployed through the right femoral sheath, at the level of the left renal artery, which was lower,  and then the proximal seal zone and junction points of the aortic portion of the graft were ironed out with the [facility] balloon. A 14 mm diameter noncompliant balloon was used in both iliacs. Completion angiogram prior  to ballooning the left iliac showed a type I left iliac leak, but after having this out with the balloon, no significant type I endoleak was seen. There was a small type II endoleak seen through lumbar collaterals, on completion angiogram as well. The StarClose closure device was deployed in the left femoral artery with excellent hemostatic result. I then closed the right femoral arteriotomy with six interrupted 6-0 Prolene sutures, Surgicel and Evicyl topical hemostatic agents were placed, and hemostasis was complete. The      wound was closed with two layers 2-0 Vicryl, two layers of 3-0 Vicryl, 4-0 Monocryl, and Dermabond was placed as dressing. The patient tolerated the procedure well and was taken to the Recovery Room in stable condition. ____________________________ Chad NeedyJason S. Ezel Vallone, MD jsd:slb D: 08/27/2011 13:50:12 ET T: 08/27/2011 15:00:05 ET JOB#: [id number]  cc: Chad NeedyJason S. Fanchon Papania, MD, <Dictator> Chad Mollyon C. Candelaria Stagershaplin, MD Chad NeedyJASON S Axten Pascucci MD ELECTRONICALLY SIGNED 08/30/2011 15:18

## 2014-10-28 NOTE — Consult Note (Signed)
Patient with a 6.6 cm AAA.  Has multiple issues including CKD so will get an uninfused CT scan for further assessment to see if it can be fixed with a stent graft.  Reasonably poor candidate for open repair  Electronic Signatures: Annice Needyew, Arlow Spiers S (MD)  (Signed on 18-Feb-13 17:04)  Authored  Last Updated: 18-Feb-13 17:04 by Annice Needyew, Orion Mole S (MD)

## 2014-10-28 NOTE — Consult Note (Signed)
PATIENT NAME:  Chad [name], Chad [name] MR#:  [id number] DATE OF BIRTH:  Jan 26, 1924  DATE OF CONSULTATION:  08/31/2011  REFERRING PHYSICIAN:  [name], MD   CONSULTING PHYSICIAN:  Kandyce RudGeorge W. Kernodle Jr., MD  REASON FOR CONSULTATION: Gout.  HISTORY OF PRESENT ILLNESS: The patient is an 81 year old white male with history of renal insufficiency and cardiomyopathy and atrial fibrillation, with complicated hospitalization with respiratory distress, congestive heart failure, found to have [name] large aneurysm and had [name] percutaneous procedure. He has [name] long history of gout. He has been on allopurinol 100 mg. He used to have [name] uric acid of 14 in 2010 and on allopurinol it was 6.2. He has also been on Colcrys [name] day, tolerating it, and diuretics and warfarin. After his surgery, he had swelling in both knees, left hand, both elbows and then right hand. He has had Colcrys orally with no diarrhea or GI upset. He has not had steroids. He is not allergic to steroids, says since can take numbing medicine when asked directly. The history mainly is with family at his bedside and by writing on [name] clipboard.   PAST MEDICAL/SURGICAL HISTORY:  1. Cardiomyopathy.  2. Atrial fibrillation.  3. Renal insufficiency.  4. Gouty arthritis.  5. Prior Methicillin-resistant Staphylococcus aureus.  6. Valvular heart disease. 7. Deafness. 8. Thrombocytopenia.  9. Aortic aneurysm. 10. Emphysema.  11. Status post bypass.  12. Appendectomy. 13. Hernia repair. 14. Cholecystectomy.   FAMILY HISTORY: History of colon cancer.  SOCIAL HISTORY: Prior smoking.  REVIEW OF SYSTEMS: Review of systems is unobtainable with conversation today.    PHYSICAL EXAMINATION:  GENERAL: No acute distress. He points to both knees and elbows and left hand as hurting with direct questioning.   VITAL SIGNS: Temperature 98, pulse 76, respirations 20, blood pressure 138/79, oxygen 97.   SKIN: Without rash.   MUSCULOSKELETAL: Good range of motion of  neck and shoulders. Both olecranon bursa are tender. Left fourth finger is swollen. Right second finger is swollen. Bilateral knee effusions. Mild tenderness first MTP.   PROCEDURE:  Left knee was prepped in [name] sterile manner. I aspirated 50 mL of inflammatory fluid and sent for microscopy. I injected with 2 mL of Xylocaine, 2 mL of Marcaine, and 1 mL of Kenalog.    The right knee was prepped in [name] sterile manner. I aspirated 30 mL of inflammatory fluid and injected with 1 mL of Kenalog.   IMPRESSION:  1. Suspect polyarticular gout given setting and joints involved.  2. Recent aneurysm repair.  3. Congestive heart.  4. Renal insufficiency on diuretics. 5. Atrial fibrillation.   RECOMMENDATION:  1. He is on Colcrys one [name] day. If  his hands do not get better with that, consider [name] prednisone taper. 2. Knee fluid was sent for microscopy to confirm crystalline nature.  3. If unimproved, I am happy to see back. Otherwise, he can be discharged on his allopurinol 100 mg [name] day and Colcrys one [name] day.  ____________________________ Kandyce RudGeorge W. Kernodle Jr., MD gwk:cbb D: 08/31/2011 19:46:17 ET T: 09/01/2011 10:06:43 ET JOB#: [id number] Webb SilversmithGEORGE W KERNODLE J MD ELECTRONICALLY SIGNED 09/02/2011 17:12

## 2014-11-04 NOTE — Consult Note (Signed)
PATIENT NAME:  Chad [name], Chad [name] MR#:  [id number] DATE OF BIRTH:  Dec 23, 1923  DATE OF CONSULTATION:  10/17/2014  REFERRING PHYSICIAN:   CONSULTING PHYSICIAN:  Midge Miniumarren Jax Kentner, MD  CONSULTING SERVICE: Gastroenterology.    CONSULTING PHYSICIAN:  Midge Miniumarren Canio Winokur, MD.   REASON FOR CONSULTATION: Possible GI bleed.   HISTORY OF PRESENT ILLNESS: This patient is an 81 year old gentleman who is hearing-impaired and communicates through sign language. The patient was admitted with respiratory distress and altered mental status. The patient was also noted to be impacted with stool and after disimpaction has been having diarrhea since admission. The patient was found to have some bright red blood per rectum in the Emergency Department. The patient denies ever having [name] colonoscopy in the past. He also states that he has some lower abdominal pain. The patient has had congestive heart failure with [name] reported ejection fraction of 15% in the past. The patient's stools have been diarrhea and have been brown recently. The patient had [name] rectal tube put in with only brown stools being produced from the rectal tube. The patient denies any rectal bleeding in the past.   PAST MEDICAL HISTORY: Atrial fibrillation, peptic ulcer disease, coronary artery disease, CABG, COPD, O2 dependency, congestive heart failure with an EF of 15, hyperlipidemia, hypertension, chronic kidney disease.   SOCIAL HISTORY: Remote tobacco use. No alcohol or drugs.   FAMILY HISTORY: Noncontributory.   ALLERGIES: VICODIN.   HOME MEDICATIONS: Aspirin, gabapentin, allopurinol, colchicine, atorvastatin, alprazolam, furosemide, lactulose for constipation.   REVIEW OF SYSTEMS: 10 point review of systems negative except what was stated above.   PHYSICAL EXAMINATION:   VITAL SIGNS: Temperature 97.6, pulse 76, respirations 22, blood pressure 110/68, pulse oximetry 100%.  HEENT: Normocephalic, atraumatic. Extraocular muscles intact. Pupils equally round  and reactive to light and accommodation without JVD, without lymphadenopathy.  LUNGS:  Decreased breath sounds bilaterally with rhonchi.  HEART: Regular rate and rhythm without murmurs, rubs, or gallops.  ABDOMEN: Soft, with mild tenderness in the lower abdomen, without rebound, without guarding.  EXTREMITIES: Without cyanosis, clubbing, or edema.  NEUROLOGICAL: The patient is otherwise grossly intact.   ASSESSMENT AND PLAN: This patient is an 81 year old gentleman who comes in with altered mental status and was found to have diarrhea with heme positive stools. The patient's troponin has gone from 0.35 to 5.89 today. His hemoglobin has also gone from 8.4 yesterday to 7.3 today. The patient has chronic renal failure and is getting blood at the bedside right now. The patient's Clostridium difficile has been negative and he will have his stool sent off for other possible pathogens as the cause of his diarrhea. The patient is not [name] candidate for any endoscopic procedures at the present time with his elevated troponin and respiratory status. I will follow the patient along with you during this hospitalization.   Thank you very much for involving me in the care of this patient. If you have any questions please do not hesitate to call.     ____________________________ Midge Miniumarren Cherity Blickenstaff, MD dw:bu D: 10/17/2014 14:24:13 ET T: 10/17/2014 15:24:47 ET JOB#: [id number]  cc: Midge Miniumarren Martell Mcfadyen, MD, <Dictator> Midge MiniumARREN Xitlali Kastens MD ELECTRONICALLY SIGNED 10/18/2014 5:12

## 2014-11-04 NOTE — Consult Note (Signed)
Brief Consult Note: Diagnosis: Patient with dirrhea and heme positive stools and anemia. He reports that he has never had a colonoscopy in the past. Patient has chrronic renal failure.   Patient was seen by consultant.   Consult note dictated.   Comments: Consitpation with disimpaction recently and diarrhea now. Was found to have heme positive stools. Will check stools for possible causes of dirrehea. Would wait until resp. status improved before any procedures. Will follow patient with you.  Electronic Signatures: Midge MiniumWohl, Cythina Mickelsen (MD)  (Signed 13-Apr-16 13:09)  Authored: Brief Consult Note   Last Updated: 13-Apr-16 13:09 by Midge MiniumWohl, Janelli Welling (MD)

## 2014-11-04 NOTE — H&P (Signed)
415.55PATIENT NAME:  Chad [name], Chad [name] MR#:  [id number] DATE OF BIRTH:  09-Jul-1923  DATE OF ADMISSION:  07/24/2014  PRIMARY CARE PROVIDER: [name], MD  CARDIOLOGIST: Lamar BlinksBruce J. Kowalski, MD   CHIEF COMPLAINT: Lower extremity swelling and shortness of breath.   HISTORY OF PRESENT ILLNESS: An 81 year old Caucasian male patient with loss of hearing,  history of cardiomyopathy with EF of 15-20%, atrial fibrillation, and congestive heart failure, presents to the Emergency Room complaining of worsening lower extremity swelling and shortness of breath. The patient does have orthopnea. He does not wear any oxygen. Here in the Emergency Room, the patient has been found to have pulmonary edema on his CT scan, along with troponin elevated at 0.10 and was being admitted to the hospitalist service for congestive heart failure exacerbation.   He has not had any cough. The patient lives alone with his dog. He ambulates with [name] cane. He does follow with Dr. Maryjane HurterFeldpausch as an outpatient.   PAST MEDICAL HISTORY:  1.  Chronic systolic congestive heart failure with ejection fraction of 15-20%.  2.  Atrial fibrillation.  3.  Chronic kidney disease stage 4.  4.  Gout.  5.  Prior history of methicillin-resistant Staphylococcus aureus.  6.  Valvular heart disease.  7.  Deafness.  8.  Thrombocytopenia.  9.  Aortic aneurysm.  10.  Emphysema.  11.  Status post coronary artery bypass graft.  12.  Appendectomy.  13.  Hernia repair.   14.  Hysterectomy.  15.  Carotid endarterectomy.   FAMILY HISTORY: Colon cancer.   SOCIAL HISTORY: The patient has smoked in the past, but quit. No alcohol. No illicit drug use. Lives at home with his dog. Ambulates with [name] cane.   CODE STATUS: Limited code with okay for resuscitation, but no intubation.   REVIEW OF SYSTEMS:  CONSTITUTIONAL: Complains of some fatigue.  EYES: No blurred vision or pain.  EARS, NOSE, AND THROAT: No tinnitus, ear pain. Has hearing loss.   RESPIRATORY: No cough or wheeze. Has shortness of breath.  CARDIOVASCULAR: No chest pain. Does have orthopnea and edema.  GASTROINTESTINAL: No nausea, vomiting, diarrhea, abdominal pain.  GENITOURINARY: No dysuria, hematuria or frequency.  ENDOCRINE: No polyuria, nocturia, thyroid problems.  HEMATOLOGIC AND LYMPHATIC: No anemia, easy bruising, bleeding.  INTEGUMENTARY: No acne, rash or lesions.  MUSCULOSKELETAL: No back pain or arthritis.  NEUROLOGICAL: No focal numbness. Has generalized weakness.  PSYCHIATRIC: No anxiety or depression.   HOME MEDICATIONS:  1.  Allopurinol 100 mg daily.  2.  Atorvastatin 20 mg daily.  3.  Gabapentin 300 mg 3 times [name] day.  4.  Metoprolol tartrate 50 mg 2 times [name] day.  5.  Torsemide 20 mg daily.  6.  Vitamin D3 2000 international units once [name] day,  7.  Xanax 0.5 mg oral once [name] day at bedtime.   PHYSICAL EXAMINATION:  VITAL SIGNS: Temperature 97.4, pulse of 92, blood pressure 116/60, saturating 94% on room air.  GENERAL: Elderly Caucasian male patient, lying in bed; overall seems comfortable.  PSYCHIATRIC: Alert and oriented x 3, pleasant.  HEENT: Atraumatic, normocephalic. Oral mucosa moist and pink. External ears and nose normal. No pallor. No icterus. Pupils bilaterally equal and reactive to light.  NECK: Supple. No thyromegaly. No palpable lymph nodes. Trachea midline. No carotid bruits. CARDIOVASCULAR: S1, S2. Systolic murmur. Peripheral pulses 2+. Has 2+ edema.  RESPIRATORY: Bilateral crackles.  GASTROINTESTINAL: Soft abdomen, nontender. Bowel sounds present. No hepatosplenomegaly palpable.  SKIN: Warm and dry. No petechiae,  rash, ulcers.  MUSCULOSKELETAL: No joint swelling, redness, effusion of the large joints. Normal muscle tone.  NEUROLOGIC: Motor strength is 5/5 in upper extremities. Sensation intact all over. LYMPHATIC: No cervical lymphadenopathy.  LABORATORY DATA:  Glucose of 95, BUN 26, creatinine 2.52, sodium 141, potassium 4.2,  chloride 108. AST, ALT, alkaline phosphatase, bilirubin normal. Troponin 0.1. WBC 4.8, hemoglobin 7.9, platelets of 99,000.   CT scan of the chest with contrast showed dilation of the ascending thoracic aorta. No pulmonary embolism. Does have congestive heart failure with emphysematous changes, bibasilar atelectasis.   Lower extremity Doppler shows no DVT on the right lower extremity.   Chest x-ray shows underlying emphysema, pulmonary edema, cardiomegaly.   EKG shows nonspecific changes.   ASSESSMENT AND PLAN:  1.  Acute on chronic systolic congestive heart failure. The patient also had elevated troponin, but I suspect the elevated troponin is secondary to demand ischemia, not non-ST segment elevation myocardial infarction. At this time, the patient will be admitted for IV Lasix therapy. No pulmonary embolism on CT. Unfortunately, the patient had contrast given along with the CT which was ordered in the Emergency Room. At this time, considering he will also be on IV Lasix, we will need to closely monitor his creatinine. Monitor ins and outs. Continue his beta blocker. We will consult cardiology regarding his elevated troponin and congestive heart failure. The patient mentions that he follows with Dr. [name].  2.  Degenerative joint disease stage 4 is stable. The patient's baseline creatinine is around 2.5 to 2.6, but needs monitoring as the patient received contrast in the Emergency Room.  3.  Hypertension. Continue medications.  4.  Paroxysmal atrial fibrillation. Continue his metoprolol.  5.  Anemia of chronic disease, stable.  6.  Chronic thrombocytopenia, stable.  7.  Deep vein thrombosis prophylaxis with sequential compression devices. No heparin or Lovenox secondary to thrombocytopenia.   CODE STATUS: Limited code with no intubation.   TIME SPENT TODAY ON THIS CASE: 45 minutes.    ____________________________ Molinda Bailiff Damisha Wolff, MD srs:MT D: 07/24/2014 14:56:53 ET T: 07/24/2014  15:22:47 ET JOB#: [id number]  cc: Wardell Heath R. Elpidio Anis, MD, <Dictator> Marina Goodell, MD Lamar Blinks, MD Orie Fisherman MD ELECTRONICALLY SIGNED 08/01/2014 16:54

## 2014-11-04 NOTE — Consult Note (Signed)
Chief Complaint:  Subjective/Chief Complaint Pt has had 5-6 loose to semiformed BMs within past 24 hrs.  Denies abdominal pain, nausea or vomiting.  Son at bedside & he signed to patient, as well as clipboard to write & lip reading during interview.   VITAL SIGNS/ANCILLARY NOTES: **Vital Signs.:   15-Apr-16 05:00  Vital Signs Type telemetry  Temperature Temperature (F) 98.4  Celsius 36.8  Temperature Source oral  Pulse Pulse 47  Respirations Respirations 18  Systolic BP Systolic BP 103  Diastolic BP (mmHg) Diastolic BP (mmHg) 66  Mean BP 78  Pulse Ox % Pulse Ox % 96  Pulse Ox Activity Level  At rest  Oxygen Delivery 2L  Pulse Ox Heart Rate 66  Telemetry pattern Cardiac Rhythm Normal sinus rhythm; Bundle Branch Block; 1st Degree heart block; pattern reported by Telemetry Clerk; trigemily pvc's hr 67    05:44  Pulse Pulse 68  Pulse source if not from Vital Sign Device per Telemetry Clerk   Brief Assessment:  GEN well developed, well nourished, no acute distress, thin, A/Ox3   Cardiac Regular   Respiratory normal resp effort   Gastrointestinal details normal Nontender  Nondistended  Bowel sounds normal  No rebound tenderness  No gaurding  No rigidity   EXTR negative cyanosis/clubbing, negative edema   Additional Physical Exam Skin: warm, dry   Assessment/Plan:  Assessment/Plan:  Assessment Diarrhea: Resolving.  Suspect looser stool s/p fecal disimpaction.  Hx overflow incontinence.  Stool studies benign thus far.  Not endoscopic candidate at this time due to cardiac issues. I have discussed her care [facility] & our plan of care is below.   Plan 1) Will follow up with GI after outpatient rehab in 3-4 weeks 2) Pt advised to call sooner if any problems 3) FU stool studies Please call if you have any questions or concerns   Electronic Signatures: Joselyn ArrowJones, Skyanne Welle L (NP)  (Signed 15-Apr-16 09:31)  Authored: Chief Complaint, VITAL SIGNS/ANCILLARY NOTES, Brief  Assessment, Assessment/Plan   Last Updated: 15-Apr-16 09:31 by Joselyn ArrowJones, Cande Mastropietro L (NP)

## 2014-11-04 NOTE — Consult Note (Signed)
Chief Complaint:  Subjective/Chief Complaint Pt writes he has diarrhea q20-30 min.  RN notes 1 soft BM yest & this AM.  C diff negative.   VITAL SIGNS/ANCILLARY NOTES: **Vital Signs.:   14-Apr-16 09:32  Vital Signs Type Routine  Temperature Temperature (F) 98.6  Temperature Source oral  Pulse Pulse 73  Respirations Respirations 18  Systolic BP Systolic BP 044  Diastolic BP (mmHg) Diastolic BP (mmHg) 69  Mean BP 84  Pulse Ox % Pulse Ox % 97  Pulse Ox Activity Level  At rest  Oxygen Delivery 2L   Brief Assessment:  GEN well developed, well nourished, no acute distress, +deaf, pt interview via written communication on clipboard   Cardiac Regular   Respiratory normal resp effort   Gastrointestinal Normal   Gastrointestinal details normal Soft  Nontender  Nondistended  Bowel sounds normal  No rebound tenderness  No gaurding  No rigidity   EXTR negative cyanosis/clubbing, negative edema   Additional Physical Exam Skin: pink, warm, dry   Lab Results: Routine Chem:  14-Apr-16 04:14   Glucose, Serum  109 (65-99 NOTE: New Reference Range  09/11/14)  BUN  57 (6-20 NOTE: New Reference Range  09/11/14)  Creatinine (comp)  2.84 (0.61-1.24 NOTE: New Reference Range  09/11/14)  Sodium, Serum  134 (135-145 NOTE: New Reference Range  09/11/14)  Potassium, Serum 4.2 (3.5-5.1 NOTE: New Reference Range  09/11/14)  Chloride, Serum 103 (101-111 NOTE: New Reference Range  09/11/14)  CO2, Serum 23 (22-32 NOTE: New Reference Range  09/11/14)  Calcium (Total), Serum  8.0 (8.9-10.3 NOTE: New Reference Range  09/11/14)  Anion Gap 8  eGFR (African American)  22  eGFR (Non-African American)  19 (eGFR values <41m/min/1.73 m2 may be an indication of chronic kidney disease (CKD). Calculated eGFR is useful in patients with stable renal function. The eGFR calculation will not be reliable in acutely ill patients when serum creatinine is changing rapidly. It is not useful in patients  on dialysis. The eGFR calculation may not be applicable to patients at the low and high extremes of body sizes, pregnant women, and vegetarians.)  Routine Hem:  14-Apr-16 04:14   WBC (CBC) 7.8  RBC (CBC)  3.63  Hemoglobin (CBC)  7.9  Hematocrit (CBC)  26.2  Platelet Count (CBC)  70  MCV  72  MCH  21.8  MCHC  30.2  RDW  21.4  Neutrophil % 83.5  Lymphocyte % 6.5  Monocyte % 8.0  Eosinophil % 1.8  Basophil % 0.2  Neutrophil #  6.6  Lymphocyte #  0.5  Monocyte # 0.6  Eosinophil # 0.1  Basophil # 0.0 (Result(s) reported on 18 Oct 2014 at 05:47AM.)   Assessment/Plan:  Assessment/Plan:  Assessment Diarrhea:  Will continue to monitor as pt has only had 2 stools in past 24 hrs recorded,  Stools studies are pending.  He is not endoscopic candidate at this time given cardiac status.   Plan 1) Continue supportive measures 2) record stools 3) follow up stool studies Please call if you have any questions or concerns   Electronic Signatures: JAndria Meuse(NP)  (Signed 14-Apr-16 09:52)  Authored: Chief Complaint, VITAL SIGNS/ANCILLARY NOTES, Brief Assessment, Lab Results, Assessment/Plan   Last Updated: 14-Apr-16 09:52 by JAndria Meuse(NP)

## 2014-11-04 NOTE — Consult Note (Signed)
PATIENT NAME:  Chad [name], [name] MR#:  [id number] DATE OF BIRTH:  1924/01/13  DATE OF CONSULTATION:  10/16/2014  REFERRING PHYSICIAN:   CONSULTING PHYSICIAN:  Laurier NancyShaukat A. Lakresha Stifter, MD   INDICATION FOR CONSULTATION: Elevated troponin.   HISTORY OF PRESENT ILLNESS:  This is an 81 year old white male who is legally deaf, who presented to the Emergency Room with altered mental status.  Apparently, he was found on the floor and was unresponsive and was brought to the Emergency Room.  He is more alert now.  He is responding to questions, however he is able to answer questions by looking at lips and he denies any chest pain but has some shortness of breath.   PAST MEDICAL HISTORY: History of CABG, history of COPD, history of CHF, history of ejection fraction of 15%.  It is not clear if he had renal failure before.  He does have history of creatinine in the past of being 2.5, not this much though.   SOCIAL HISTORY: No history of EtOH abuse or smoking.   FAMILY HISTORY: Positive for coronary artery disease.   ALLERGIES: VICODIN.   HOME MEDICATIONS: Metoprolol, torsemide, Lipitor 20 mg.   PHYSICAL EXAMINATION:  GENERAL: He is alert and oriented in no acute distress right now.  He is feeling much better.  VITAL SIGNS: Blood pressure is 107/67, respirations 19, pulse 61, temperature 97.6, saturation is 97%.  NECK: No JVD.  LUNGS: Clear.  HEART: Regular rate and rhythm. Normal S1, S2. No audible murmur.  ABDOMEN: Soft, nontender, positive bowel sounds.  EXTREMITIES: No pedal edema.  NEUROLOGIC: Appears to be intact.   LABORATORY AND DIAGNOSTICS: His EKG done earlier showed sinus rhythm with first-degree AV block, right bundle branch block, some ischemic changes inferolaterally.  His potassium was 5.4.  Lactic acid was 3.3. Creatinine was 3.17 as mentioned, baseline is 2.5.  BUN is 49, glucose is 132. His white count was 14.8.  Hemoglobin was 8.4, and platelet count was 97,000.  Troponin 4.22. CPK is  20.9.  Initial troponin was 0.35, it appears to be going up.   ASSESSMENT AND PLAN: The patient had initially altered mental status and a fall. EKG shows ischemic changes inferolaterally, with initially troponin being only mildly elevated. It is much higher right now over 4, but the patient denies any chest pain, just has mild shortness of breath.  He does have a history of coronary artery disease status post coronary artery bypass graft and severe LV dysfunction.  I advise getting an echocardiogram, renal consults and continue IV fluid.  At this time, he is not a candidate for cardiac catheterization.   Thank you very much for the referral.     ____________________________ Laurier NancyShaukat A. Britnie Colville, MD sak:DT D: 10/16/2014 08:47:22 ET T: 10/16/2014 09:04:13 ET JOB#: [id number]  Wynelle LinkSHAUKAT A Jonathon Castelo MD ELECTRONICALLY SIGNED 10/16/2014 13:34

## 2014-11-04 NOTE — Discharge Summary (Signed)
PATIENT NAME:  Chad [name], Chad [name] MR#:  [id number] DATE OF BIRTH:  05/16/1924  DATE OF ADMISSION:  07/24/2014 DATE OF DISCHARGE:  07/26/2014  PRIMARY CARE PROVIDER: [name], MD   DISCHARGE DIAGNOSES:  1.  Acute on chronic systolic congestive heart failure.  2.  Acute respiratory failure.  3.  Elevated troponin.  4.  Hypertension.  5.  Chronic kidney disease stage 4.  6.  Paroxysmal atrial fibrillation.  7.  Neuropathy.  8.  Hearing loss.  9.  Chronic obstructive pulmonary disease.   DISCHARGE MEDICATIONS:  1.  Atorvastatin 20 mg daily.  2.  Allopurinol 100 mg daily.  3.  Metoprolol tartrate 50 mg oral 2 times [name] day.  4.  Gabapentin 300 mg oral 3 times [name] day.  5.  Xanax 0.5 mg oral once [name] day at bedtime.  6.  Vitamin D3 at 2000 international units daily.  7.  Torsemide 20 mg 2 times [name] day.   DISCHARGE INSTRUCTIONS: Continuous oxygen 2 liters. Low-sodium diet of regular consistency. Daily fluids less than 2 liters. Activity as tolerated, using [name] walker. Follow up with primary care physician in 1-2 weeks. Physical therapy and nurse have been set up through home health. Daily weights.   IMAGING STUDIES: Include [name] chest x-ray which showed pulmonary edema.   ADMITTING HISTORY AND PHYSICAL AND HOSPITAL COURSE: Please see detailed H and P dictated previously. In brief 81 year old Caucasian male patient with history of chronic systolic CHF with EF of 20%, atrial fibrillation, CHF, presented to the hospital complaining of shortness of breath and lower extremity swelling. The patient was found to have CHF exacerbation and admitted to hospitalist service.   HOSPITAL COURSE:  1.  Acute on chronic systolic CHF. The patient was started on IV Lasix with which he diuresed well. His swelling is almost resolved. His shortness of breath has resolved. The patient feels much better. He has had acute respiratory failure, still needing oxygen secondary to combination of COPD, CHF, and this has  been set up at home. The patient has also been set up with home health with RN and PT along with daily weights. His torsemide has been increased to 20 mg b.i.d. The patient has been started on [name] beta blocker. An ACE inhibitor has not been started due to borderline low normal blood pressure, this needs to be considered when he follows up with his primary care physician.  2.  CKD stage 4, stable.  3.  The patient had multiple bedbugs on arrival to the Emergency Room for which he was on isolation. Adult protective services has been involved in the case. The patient was alert and oriented x 3. No history of abuse. He has wanted to go home and home health has been set up.    DISCHARGE PHYSICAL EXAMINATION: Prior to discharge, the patient has no crackles on examination, has mild edema in both lower extremities. Alert and oriented x 3. The patient does have hearing loss for which an interpreter was used all through his hospital stay for communication.   TIME SPENT ON DAY OF DISCHARGE IN DISCHARGE ACTIVITY: Forty minutes.    ____________________________ Molinda BailiffSrikar R. Terianne Thaker, MD srs:TT D: 08/01/2014 13:15:26 ET T: 08/01/2014 20:47:45 ET JOB#: [id number]  cc: Wardell HeathSrikar R. Azyiah Bo, MD, <Dictator> Orie FishermanSRIKAR R Caty Tessler MD ELECTRONICALLY SIGNED 08/04/2014 1:17

## 2014-11-04 NOTE — Discharge Summary (Signed)
PATIENT NAME:  Chad [name], Chad [name] MR#:  [id number] DATE OF BIRTH:  Sep 05, 1923  DATE OF ADMISSION:  10/16/2014 DATE OF DISCHARGE:  10/19/2014  PRIMARY CARE PHYSICIAN: Chad Overlieale Feldspausch, MD  PRESENTING COMPLAINT: Altered mental status.  CONSULTANTS: 1.  Cardiology - Dr. Welton FlakesKhan.  2.  Gastroenterology - Dr. Servando SnareWohl.  3.  Nephrology - Dr. Cherylann RatelLateef.   DISCHARGE DIAGNOSES: 1.  Sepsis secondary to community-acquired pneumonia.  2.  Mild sigmoid colitis with rectal impaction, improving.  3.  Anemia of chronic disease status post 1 unit of blood transfusion. No active bleeding.  4.  Elevated troponin, suspect non-Q-wave myocardial infarction, medical management.  5.  Known severe cardiomyopathy with ejection fraction of 20% to 25%.  6.  History of non-sustained ventricular tachycardia/bigeminy.  7.  Hyperlipidemia.  8.  Essential hypertension.  9.  History of impaired hearing.   CODE STATUS: FULL.  DISCHARGE DIET: Two grams sodium diet.   ACTIVITY: Physical therapy at rehabilitation.   DISCHARGE MEDICATIONS: 1.  Tylenol 650 q. 4 p.r.n.  2.  Atorvastatin 40 mg daily.  3.  Gabapentin 300 mg t.i.d.  4.  Metoprolol 25 mg b.i.d.  5.  Aspirin 81 mg daily.  6.  Levaquin 250 p.o. daily for 5 more days.   DIAGNOSTIC DATA: Labs at discharge: Hemoglobin and hematocrit 7.9 and 26.2, white count 7.8.   Creatinine 2.8, sodium 134, potassium 4.2, chloride 103. Lactic acid on admission was 3.3.   Troponin was 0.35, 4.22, 5.89. White count on admission was 12.7, at discharge is 7.8.   Blood cultures negative. Urine culture no growth. C. diff negative.   BRIEF SUMMARY OF HOSPITAL COURSE: Chad [name] is an 81 year old Caucasian gentleman with history of impaired hearing, hypertension, severe cardiomyopathy with EF of 20% to 25%, presented to the Emergency Room with altered mental status. He also has history of atrial fibrillation and CAD status post CABG in the past. He comes in with:  1.  Sepsis meeting  criteria by leukocytosis, lactic acidosis and elevated white count along with tachycardia. He was diagnosed with community-acquired pneumonia. Given his presentation, he was started on IV Zosyn and vancomycin which was changed to p.o. Levaquin. Cultures remained negative. The patient improved. White count stabilized.  2.  Sigmoid colitis with rectal impaction. Gastroenterology input was obtained with Dr. Servando SnareWohl. Antibiotics were continued. The patient now having soft stools. C. diff was negative.  3.  Anemia of chronic disease. No active bleeding noted. One unit of blood transfusion was given.  4.  Non-Q-wave MI in the setting of sepsis. Given the patient's overall presentation and his CKD, cardiology, Dr. Milta DeitersKhan's consultation was obtained who recommended conservative management. He is continued on low-dose aspirin and low-dose beta blockers. Unable to give ACE inhibitors secondary to his severe CKD stage III to IV. His EF was 20% to 25%. He did have some runs of NSVT and bigeminy on his monitor which is suspected due to his severe cardiomyopathy.  5.  Hyperlipidemia. Continue statins.  6.  Hypertension. On low-dose beta blockers.  Hospital stay otherwise remained stable. The patient was agreeable to go to rehab with help of physical therapy evaluation. I spoke with daughter, Fannie KneeSue, who is also agreeable with rehab at this time. [name] FULL code.  TIME SPENT: 40 minutes.  ____________________________ Chad [name]. Allena KatzPatel, MD sap:sb D: 10/19/2014 08:28:46 ET T: 10/19/2014 11:47:07 ET JOB#: [id number]  cc: Chad [name]. Allena KatzPatel, MD, <Dictator> Willow OraSONA [name] Chad Carmicheal MD ELECTRONICALLY SIGNED 10/24/2014 10:33

## 2014-11-04 NOTE — H&P (Signed)
PATIENT NAME:  Chad [name], Chad [name] MR#:  [id number] DATE OF BIRTH:  1923/11/03  DATE OF ADMISSION:  10/16/2014  REFERRING PHYSICIAN:  Eartha Inch. York Cerise, MD    PRIMARY CARE PHYSICIAN: Marina Goodell, MD    CHIEF COMPLAINT: Altered mental status.   HISTORY OF PRESENT ILLNESS:  An 81 year old Caucasian gentleman who happens to be deaf, past medical history of atrial fibrillation, coronary artery disease, status post CABG, COPD, unspecified, congestive heart failure systolic with an EF of 15% presenting with altered mental status. History obtained from nursing staff and family. Per family, patient noted to be altered.  Today when daughter was unable to get a hold of him so she  went to visit. She found him covered with feces and urine, thus sent to the hospital for further workup and evaluation.  On arrival was noted to be hypotensive as well as having diarrhea, bright red blood per rectum in the Emergency Department.   REVIEW OF SYSTEMS: Unobtainable from the patient given medical condition and mental status PAST MEDICAL HISTORY: Includes atrial fibrillation, history of peptic ulcer disease, coronary artery disease, status post CABG, COPD, non-O2 dependent, congestive heart failure, EF 15%, hyperlipidemia, unspecified, hypertension, essential, chronic kidney disease, baseline creatinine around 2.5.   SOCIAL HISTORY:  Remote tobacco use. No alcohol or drug use. Currently lives alone.   FAMILY HISTORY: Per documentation, no known cardiovascular or pulmonary disorders.   ALLERGIES: VICODIN.   HOME MEDICATIONS: Include aspirin 325 mg p.o. q. daily, gabapentin 300 mg p.o. 3 times daily, allopurinol 100 mg  p.o. q. daily, colchicine 0.6 mg p.o. 2 times weekly, atorvastatin 20 mg p.o. q. daily, alprazolam 0.25 mg p.o. at bedtime, metoprolol tartrate 50 mg p.o. b.i.d., torsemide 20 mg p.o. q. daily, lactulose 15 mL daily as needed for constipation, vitamin D3 2000 international units p.o. q. daily.    PHYSICAL EXAMINATION:  VITAL SIGNS: Temperature 102.9, heart rate 86, respirations 26, blood pressure 82/57, saturating90s% on 2 liters nasal cannula, weight 77.1, kg, BMI 24.4.  GENERAL: Chronically ill appearing Caucasian gentleman currently in minimal distress given mental status.  HEAD: Normocephalic, atraumatic.  EYES: Pupils equal, round, react to light. Extraocular muscles intact. No scleral icterus.  MOUTH: Dry mucosal membrane. Dentition intact. No abscess noted.  EAR, NOSE, THROAT: Clear without exudates. No external lesions.  NECK: Supple. No thyromegaly. No nodules. No JVD.  PULMONARY: Diminished breath sounds secondary to poor respiratory effort as well as some right basilar coarse rhonchi.  No use of accessory muscles. Poor respiratory effort as stated above.  CARDIOVASCULAR: S1, S2, regular rate, regular rhythm. No murmurs, rubs, or gallops. No edema. Pedal pulses 2+ bilaterally. GASTROINTESTINAL: Soft, nontender, nondistended. No masses. Positive bowel sounds. No hepatosplenomegaly.  MUSCULOSKELETAL: No swelling, clubbing, edema. Range of motion full in all extremities.  NEUROLOGIC: Unable to fully assess given patient's mental status, medical condition and not following commands at this time.   SKIN: No ulceration, lesions, rashes, or cyanosis. Skin warm and dry. Turgor intact.  PSYCHIATRIC: Mood and affect blunted.  He is awake however not alert. He is deaf which somewhat complicates the process.  Different from baseline per family.    CT chest, abdomen, and pelvis performed, which reveals  right-sided pneumonia, proctitis as well as sigmoid colitis with rectal impaction. Chest x-ray performed: Right-sided pneumonia.   Remainder of laboratory data: Sodium 136, potassium 4.3, chloride 102, bicarbonate 23, BUN 49, creatinine 3.18, glucose 113, lactic acid 3.6, protein 6.7, albumin 3.4, bilirubin 1.9. Otherwise, LFTs  within normal limits. Troponin I 0.35. WBC is 12.7, hemoglobin  8.3, platelets of 113,000. Urinalysis negative for evidence of infection. ABG: 7.4, 35, 50.   ASSESSMENT AND PLAN: An 81 year old Caucasian gentleman who is deaf, history of atrial fibrillation, coronary artery disease, status post coronary artery bypass graft, chronic obstructive pulmonary disease per documentation oxygen requiring, congestive heart failure, ejection fraction 15%, presenting with altered mental status.   1.  Sepsis, severe, meeting  septic criteria by leukocytosis, temperature, severe criteria by lactic acid, secondary to community-acquired pneumonia. Pan culture including blood, sputum, IV fluid hydration 30 mL/kilogram bolus he received already, continue with IV fluid hydration keep mean arterial pressure greated than 65, repeat lactic acid 3 hours, broad-spectrum antibiotic coverage with vancomycin and Zosyn already initiated in the Emergency Department.  We will continue this for now and then titrate based off of culture data.  2.  Proctitis/sigmoid colitis with rectal impaction. We will consult gastroenterology.  3.  Gastrointestinal bleed bright red blood  per rectum. CBCs q. 6 hours. Transfusion threshold hemoglobin less than 7. Consult gastroenterology as stated above. No indication for transfusion at this time.  4.  Elevated troponin. Trend cardiac enzymes x 3. Place on telemetry. Avoid further anticoagulation given GI bleed.  5.  Hyperlipidemia, unspecified, statin therapy.   6.  Hypertension, essential, Lopressor.  7.  Venous thromboembolic prophylaxis, sequential compression device.    CARDIOVASCULAR: The patient is full code.   TIME SPENT: 55 minutes of critical care.    ____________________________ Cletis Athensavid K. Lasundra Hascall, MD dkh:AT D: 10/16/2014 02:04:02 ET T: 10/16/2014 03:20:49 ET JOB#: [id number]  cc: Cletis Athensavid K. Jules Baty, MD, <Dictator> Keyontae Huckeby Synetta ShadowK Jowanda Heeg MD ELECTRONICALLY SIGNED 10/17/2014 2:10

## 2014-11-14 ENCOUNTER — Ambulatory Visit: Payer: Self-pay | Admitting: Family

## 2015-03-26 ENCOUNTER — Emergency Department: Payer: Medicare Other

## 2015-03-26 ENCOUNTER — Emergency Department
Admission: EM | Admit: 2015-03-26 | Discharge: 2015-03-26 | Disposition: A | Discharge status: Home / Self Care | Payer: Medicare Other | Attending: Emergency Medicine | Admitting: Emergency Medicine

## 2015-03-26 ENCOUNTER — Encounter: Payer: Self-pay | Admitting: Emergency Medicine

## 2015-03-26 DIAGNOSIS — Z87891 Personal history of nicotine dependence: Secondary | ICD-10-CM | POA: Insufficient documentation

## 2015-03-26 DIAGNOSIS — N184 Chronic kidney disease, stage 4 (severe): Secondary | ICD-10-CM | POA: Diagnosis not present

## 2015-03-26 DIAGNOSIS — Z79899 Other long term (current) drug therapy: Secondary | ICD-10-CM | POA: Diagnosis not present

## 2015-03-26 DIAGNOSIS — I129 Hypertensive chronic kidney disease with stage 1 through stage 4 chronic kidney disease, or unspecified chronic kidney disease: Secondary | ICD-10-CM | POA: Diagnosis not present

## 2015-03-26 DIAGNOSIS — M1611 Unilateral primary osteoarthritis, right hip: Secondary | ICD-10-CM | POA: Insufficient documentation

## 2015-03-26 DIAGNOSIS — Z7982 Long term (current) use of aspirin: Secondary | ICD-10-CM | POA: Insufficient documentation

## 2015-03-26 DIAGNOSIS — M25551 Pain in right hip: Secondary | ICD-10-CM | POA: Diagnosis present

## 2015-03-26 MED ORDER — ACETAMINOPHEN 500 MG PO TABS
1000.0000 mg | ORAL_TABLET | Freq: Once | ORAL | Status: AC
  Administered 2015-03-26: 1000 mg via ORAL
  Filled 2015-03-26: qty 2

## 2015-03-26 NOTE — ED Notes (Signed)
Pt back from CT

## 2015-03-26 NOTE — ED Notes (Signed)
MD in to followup with pt

## 2015-03-26 NOTE — ED Provider Notes (Signed)
[facility] Emergency Department Lars Jeziorski Note  ____________________________________________  Time seen: Approximately 10:58 PM  I have reviewed the triage vital signs and the nursing notes.   HISTORY  Chief Complaint Hip Pain  Patient uses some words and sign language to communicate. My history of present is obtained by the patient but with the help of his son who does sign language.  HPI Chad [name] is a 81 y.o. male with a history of osteoarthritis, gout, and aortic aneurysm presenting with right hip pain. Patient reports that yesterday he was sitting on the commode when he had the acute onset of a right hip pain. He denies any trauma, he has been able to ambulate. No chest pain, shortness of breath, palpitations, syncope, numbness, tingling, weakness. No fevers or chills, nausea or vomiting. Has not tried anything for the pain.   Past Medical History  Diagnosis Date  . Arrhythmia     Atrial Fib  . Coronary artery disease   . Hyperlipidemia   . Hypertension   . Chronic kidney disease     stage 4  . Emphysema/COPD   . Aortic aneurysm   . MRSA infection greater than 3 months ago   . Gout   . Thrombocytopenia     Patient Active Problem List   Diagnosis Date Noted  . Chronic systolic heart failure 10/09/2014    Past Surgical History  Procedure Laterality Date  . Coronary artery bypass graft      x4  . Carotid endarterectomy    . Abdominal aortic aneurysm repair    . Appendectomy    . Hernia repair      Current Outpatient Rx  Name  Route  Sig  Dispense  Refill  . allopurinol (ZYLOPRIM) 100 MG tablet   Oral   Take 100 mg by mouth daily.         Marland Kitchen ALPRAZolam (XANAX) 0.25 MG tablet   Oral   Take 0.25 mg by mouth at bedtime.         Marland Kitchen aspirin 325 MG tablet   Oral   Take 325 mg by mouth daily.         Marland Kitchen atorvastatin (LIPITOR) 20 MG tablet   Oral   Take 20 mg by mouth daily.         . Cholecalciferol (VITAMIN D3) 2000  UNITS TABS   Oral   Take 1 capsule by mouth daily.         . colchicine 0.6 MG tablet   Oral   Take 0.6 mg by mouth 2 (two) times a week.         . gabapentin (NEURONTIN) 300 MG capsule   Oral   Take 300 mg by mouth 3 (three) times daily.         Marland Kitchen lactulose (CHRONULAC) 10 GM/15ML solution   Oral   Take 10 g by mouth daily as needed for mild constipation.         . metoprolol (LOPRESSOR) 50 MG tablet   Oral   Take 50 mg by mouth 2 (two) times daily.         Marland Kitchen torsemide (DEMADEX) 20 MG tablet   Oral   Take 20 mg by mouth daily.           Allergies Vicodin  Family History  Problem Relation Age of Onset  . Stroke Father     Social History Social History  Substance Use Topics  . Smoking status: Former Smoker --  1.00 packs/day for 0 years    Types: Cigarettes    Quit date: 10/09/2006  . Smokeless tobacco: Never Used  . Alcohol Use: No    Review of Systems Constitutional: No fever/chills Eyes: No visual changes. ENT: No sore throat. Cardiovascular: Denies chest pain, palpitations. Respiratory: Denies shortness of breath.  No cough. Gastrointestinal: No abdominal pain.  No nausea, no vomiting.  No diarrhea.  No constipation. Genitourinary: Negative for dysuria. Musculoskeletal: Negative for back pain. Positive for right hip pain Skin: Negative for rash. Neurological: Negative for headaches, focal weakness or numbness.  10-point ROS otherwise negative.  ____________________________________________   PHYSICAL EXAM:  VITAL SIGNS: ED Triage Vitals  Enc Vitals Group     BP 03/26/15 1838 159/80 mmHg     Pulse Rate 03/26/15 1838 105     Resp 03/26/15 1838 20     Temp 03/26/15 1838 98.6 F (37 C)     Temp src --      SpO2 03/26/15 2217 97 %     Weight 03/26/15 1838 170 lb (77.111 kg)     Height 03/26/15 1838  (1.753 m)     Head Cir --      Peak Flow --      Pain Score 03/26/15 1818 7     Pain Loc --      Pain Edu? --      Excl. in GC?  --     Constitutional: Alert and oriented. Well appearing and in no acute distress. Answer question appropriately. Eyes: Conjunctivae are normal.  EOMI. Head: Atraumatic. Nose: No congestion/rhinnorhea. Mouth/Throat: Mucous membranes are moist.  Neck: No stridor.  Supple.  Full range of motion without pain. Cardiovascular: Normal rate, regular rhythm. No murmurs, rubs or gallops.  Respiratory: Normal respiratory effort.  No retractions. Lungs CTAB.  No wheezes, rales or ronchi. Gastrointestinal: Soft and nontender. No distention. No peritoneal signs. No palpable or pulsating mass. Musculoskeletal: No LE edema. Full range of motion of the right ankle without pain. Movement of the right knee reproduces some pain in the right hip. No pain over the greater trochanter. Reproducible pain with movement of the right hip. Normal DP and PT pulse on the right. Cap refill less than 2 seconds on the right. Normal sensation to light touch throughout the right lower extremity. Neurologic:  Normal speech and language. No gross focal neurologic deficits are appreciated.  Skin:  Skin is warm, dry and intact. No rash noted Psychiatric: Mood and affect are normal. Speech and behavior are normal.  Normal judgement.  ____________________________________________   LABS (all labs ordered are listed, but only abnormal results are displayed)  Labs Reviewed - No data to display ____________________________________________  EKG  Not indicated. ____________________________________________  RADIOLOGY  No results found.  ____________________________________________   PROCEDURES  Procedure(s) performed: None  Critical Care performed: No ____________________________________________   INITIAL IMPRESSION / ASSESSMENT AND PLAN / ED COURSE  Pertinent labs & imaging results that were available during my care of the patient were reviewed by me and considered in my medical decision making (see chart for  details).  81 y.o. male with history of osteoarthritis, abdominal aortic aneurysm presenting with right hip pain. It is likely that the patient has acute exacerbation of his arthritis, but I will get a CT pelvis to rule out right hip fracture as his plain x-ray from triage is negative. He does have a known AAA, but his symptoms are not consistent with dissection. In addition, he has known  renal failure with a baseline creatinine of > 2.5 in April. The likelihood of damage from IV contrast dye outweighs the benefit of evaluating for dissection given that his clinical picture does not match this. He continues to have normal neurovascular exam. If his CT pelvis is negative, I will anticipate discharge home but will give the family and patient red flag warnings and return precautions.  ____________________________________________  FINAL CLINICAL IMPRESSION(S) / ED DIAGNOSES  Final diagnoses:  Primary osteoarthritis of right hip      NEW MEDICATIONS STARTED DURING THIS VISIT:  Discharge Medication List as of 03/26/2015 11:33 PM       Rockne Menghini, MD 03/28/15 0005

## 2015-03-26 NOTE — ED Notes (Signed)
Pt to triage via wheelchair.  Pt has right hip pain since yesterday.   Reports getting off of toilet yesterday and pain began .  Pt did not fall.  Pt is hearing impaired.

## 2015-03-26 NOTE — Discharge Instructions (Signed)
Arthritis, Nonspecific °Arthritis is inflammation of a joint. This usually means pain, redness, warmth or swelling are present. One or more joints may be involved. There are a number of types of arthritis. Your caregiver may not be able to tell what type of arthritis you have right away. °CAUSES  °The most common cause of arthritis is the wear and tear on the joint (osteoarthritis). This causes damage to the cartilage, which can break down over time. The knees, hips, back and neck are most often affected by this type of arthritis. °Other types of arthritis and common causes of joint pain include: °· Sprains and other injuries near the joint. Sometimes minor sprains and injuries cause pain and swelling that develop hours later. °· Rheumatoid arthritis. This affects hands, feet and knees. It usually affects both sides of your body at the same time. It is often associated with chronic ailments, fever, weight loss and general weakness. °· Crystal arthritis. Gout and pseudo gout can cause occasional acute severe pain, redness and swelling in the foot, ankle, or knee. °· Infectious arthritis. Bacteria can get into a joint through a break in overlying skin. This can cause infection of the joint. Bacteria and viruses can also spread through the blood and affect your joints. °· Drug, infectious and allergy reactions. Sometimes joints can become mildly painful and slightly swollen with these types of illnesses. °SYMPTOMS  °· Pain is the main symptom. °· Your joint or joints can also be red, swollen and warm or hot to the touch. °· You may have a fever with certain types of arthritis, or even feel overall ill. °· The joint with arthritis will hurt with movement. Stiffness is present with some types of arthritis. °DIAGNOSIS  °Your caregiver will suspect arthritis based on your description of your symptoms and on your exam. Testing may be needed to find the type of arthritis: °· Blood and sometimes urine tests. °· X-ray tests  and sometimes CT or MRI scans. °· Removal of fluid from the joint (arthrocentesis) is done to check for bacteria, crystals or other causes. Your caregiver (or a specialist) will numb the area over the joint with a local anesthetic, and use a needle to remove joint fluid for examination. This procedure is only minimally uncomfortable. °· Even with these tests, your caregiver may not be able to tell what kind of arthritis you have. Consultation with a specialist (rheumatologist) may be helpful. °TREATMENT  °Your caregiver will discuss with you treatment specific to your type of arthritis. If the specific type cannot be determined, then the following general recommendations may apply. °Treatment of severe joint pain includes: °· Rest. °· Elevation. °· Anti-inflammatory medication (for example, ibuprofen) may be prescribed. Avoiding activities that cause increased pain. °· Only take over-the-counter or prescription medicines for pain and discomfort as recommended by your caregiver. °· Cold packs over an inflamed joint may be used for 10 to 15 minutes every hour. Hot packs sometimes feel better, but do not use overnight. Do not use hot packs if you are diabetic without your caregiver's permission. °· A cortisone shot into arthritic joints may help reduce pain and swelling. °· Any acute arthritis that gets worse over the next 1 to 2 days needs to be looked at to be sure there is no joint infection. °Long-term arthritis treatment involves modifying activities and lifestyle to reduce joint stress jarring. This can include weight loss. Also, exercise is needed to nourish the joint cartilage and remove waste. This helps keep the muscles   around the joint strong. HOME CARE INSTRUCTIONS   Do not take aspirin to relieve pain if gout is suspected. This elevates uric acid levels.  Only take over-the-counter or prescription medicines for pain, discomfort or fever as directed by your caregiver.  Rest the joint as much as  possible.  If your joint is swollen, keep it elevated.  Use crutches if the painful joint is in your leg.  Drinking plenty of fluids may help for certain types of arthritis.  Follow your caregiver's dietary instructions.  Try low-impact exercise such as:  Swimming.  Water aerobics.  Biking.  Walking.  Morning stiffness is often relieved by a warm shower.  Put your joints through regular range-of-motion. SEEK MEDICAL CARE IF:   You do not feel better in 24 hours or are getting worse.  You have side effects to medications, or are not getting better with treatment. SEEK IMMEDIATE MEDICAL CARE IF:   You have a fever.  You develop severe joint pain, swelling or redness.  Many joints are involved and become painful and swollen.  There is severe back pain and/or leg weakness.  You have loss of bowel or bladder control. Document Released: 07/30/2004 Document Revised: 09/14/2011 Document Reviewed: 08/15/2008 [facility] Patient Information 2015 [place], [place]. This information is not intended to replace advice given to you by your health care provider. Make sure you discuss any questions you have with your health care provider.  Please continue to take all your medications as prescribed. He may takeTylenol for pain. Please return to the emergency department if you develop severe chest, back, or leg pain, or if he develop numbness, tingling, or weakness in your legs, shortness of breath, fainting, or any other symptoms concerning to you.

## 2015-03-26 NOTE — ED Notes (Signed)
Patient transported to CT 

## 2015-06-30 ENCOUNTER — Encounter: Payer: Self-pay | Admitting: Emergency Medicine

## 2015-06-30 ENCOUNTER — Emergency Department
Admission: EM | Admit: 2015-06-30 | Discharge: 2015-06-30 | Disposition: A | Discharge status: Home / Self Care | Payer: Medicare Other | Attending: Emergency Medicine | Admitting: Emergency Medicine

## 2015-06-30 DIAGNOSIS — Y9389 Activity, other specified: Secondary | ICD-10-CM | POA: Diagnosis not present

## 2015-06-30 DIAGNOSIS — Z79899 Other long term (current) drug therapy: Secondary | ICD-10-CM | POA: Diagnosis not present

## 2015-06-30 DIAGNOSIS — M546 Pain in thoracic spine: Secondary | ICD-10-CM | POA: Diagnosis not present

## 2015-06-30 DIAGNOSIS — X58XXXA Exposure to other specified factors, initial encounter: Secondary | ICD-10-CM | POA: Insufficient documentation

## 2015-06-30 DIAGNOSIS — Z87891 Personal history of nicotine dependence: Secondary | ICD-10-CM | POA: Insufficient documentation

## 2015-06-30 DIAGNOSIS — N184 Chronic kidney disease, stage 4 (severe): Secondary | ICD-10-CM | POA: Insufficient documentation

## 2015-06-30 DIAGNOSIS — Y9289 Other specified places as the place of occurrence of the external cause: Secondary | ICD-10-CM | POA: Insufficient documentation

## 2015-06-30 DIAGNOSIS — S199XXA Unspecified injury of neck, initial encounter: Secondary | ICD-10-CM | POA: Diagnosis present

## 2015-06-30 DIAGNOSIS — Y998 Other external cause status: Secondary | ICD-10-CM | POA: Insufficient documentation

## 2015-06-30 DIAGNOSIS — M7918 Myalgia, other site: Secondary | ICD-10-CM

## 2015-06-30 DIAGNOSIS — I129 Hypertensive chronic kidney disease with stage 1 through stage 4 chronic kidney disease, or unspecified chronic kidney disease: Secondary | ICD-10-CM | POA: Diagnosis not present

## 2015-06-30 DIAGNOSIS — Z7982 Long term (current) use of aspirin: Secondary | ICD-10-CM | POA: Insufficient documentation

## 2015-06-30 DIAGNOSIS — S161XXA Strain of muscle, fascia and tendon at neck level, initial encounter: Secondary | ICD-10-CM | POA: Insufficient documentation

## 2015-06-30 HISTORY — DX: Heart failure, unspecified: I50.9

## 2015-06-30 MED ORDER — CYCLOBENZAPRINE HCL 10 MG PO TABS
5.0000 mg | ORAL_TABLET | Freq: Once | ORAL | Status: AC
  Administered 2015-06-30: 5 mg via ORAL
  Filled 2015-06-30: qty 1

## 2015-06-30 MED ORDER — CYCLOBENZAPRINE HCL 5 MG PO TABS: 5.0000 mg | ORAL_TABLET | Freq: Three times a day (TID) | ORAL | Status: AC | PRN

## 2015-06-30 NOTE — ED Provider Notes (Signed)
[facility] Emergency Department Provider Note  Time seen: 11:20 PM  I have reviewed the triage vital signs and the nursing notes.   HISTORY  Chief Complaint Neck Pain    HPI Chad [name] is a 81 y.o. male with a past medical history of CAD, hypertension, hyperlipidemia, CK-MB, CHF, who presents the emergency department with neck/back pain. According to the patient for the past 3 days he has had discomfort on the right side of his neck and right upper back. Somewhat worse with movement. Somewhat improved pushing on the area but then it becomes painful when he releases pressure. Denies any recent trauma or falls. Denies any pleuritic nature of the pain. Per the daughter the patient does have a history of a blood clot, but states the patient was not an anticoagulation candidate due to his falls.     Past Medical History  Diagnosis Date  . Arrhythmia     Atrial Fib  . Coronary artery disease   . Hyperlipidemia   . Hypertension   . Chronic kidney disease     stage 4  . Emphysema/COPD (HCC)   . Aortic aneurysm (HCC)   . MRSA infection greater than 3 months ago   . Gout   . Thrombocytopenia (HCC)   . CHF (congestive heart failure) [facility](HCC)     Patient Active Problem List   Diagnosis Date Noted  . Chronic systolic heart failure (HCC) 10/09/2014    Past Surgical History  Procedure Laterality Date  . Coronary artery bypass graft      x4  . Carotid endarterectomy    . Abdominal aortic aneurysm repair    . Appendectomy    . Hernia repair      Current Outpatient Rx  Name  Route  Sig  Dispense  Refill  . allopurinol (ZYLOPRIM) 100 MG tablet   Oral   Take 100 mg by mouth daily.         Marland Kitchen. ALPRAZolam (XANAX) 0.25 MG tablet   Oral   Take 0.25 mg by mouth at bedtime.         Marland Kitchen. aspirin 325 MG tablet   Oral   Take 325 mg by mouth daily.         Marland Kitchen. atorvastatin (LIPITOR) 20 MG tablet   Oral   Take 20 mg by mouth daily.         .  Cholecalciferol (VITAMIN D3) 2000 UNITS TABS   Oral   Take 1 capsule by mouth daily.         . colchicine 0.6 MG tablet   Oral   Take 0.6 mg by mouth 2 (two) times a week.         . cyclobenzaprine (FLEXERIL) 5 MG tablet   Oral   Take 1 tablet (5 mg total) by mouth every 8 (eight) hours as needed for muscle spasms.   30 tablet   0   . gabapentin (NEURONTIN) 300 MG capsule   Oral   Take 300 mg by mouth 3 (three) times daily.         Marland Kitchen. lactulose (CHRONULAC) 10 GM/15ML solution   Oral   Take 10 g by mouth daily as needed for mild constipation.         . metoprolol (LOPRESSOR) 50 MG tablet   Oral   Take 50 mg by mouth 2 (two) times daily.         Marland Kitchen. torsemide (DEMADEX) 20 MG tablet   Oral  Take 20 mg by mouth daily.           Allergies Vicodin  Family History  Problem Relation Age of Onset  . Stroke Father     Social History Social History  Substance Use Topics  . Smoking status: Former Smoker -- 1.00 packs/day for 0 years    Types: Cigarettes    Quit date: 10/09/2006  . Smokeless tobacco: Never Used  . Alcohol Use: No    Review of Systems Constitutional: Negative for fever. Cardiovascular: Negative for chest pain. Respiratory: Negative for shortness of breath. Gastrointestinal: Negative for abdominal pain Musculoskeletal: As it for right upper back pain. Right neck pain. Neurological: Negative for headaches, focal weakness or numbness. 10-point ROS otherwise negative.  ____________________________________________   PHYSICAL EXAM:  VITAL SIGNS: ED Triage Vitals  Enc Vitals Group     BP 06/30/15 1922 161/91 mmHg     Pulse Rate 06/30/15 1922 85     Resp 06/30/15 1922 18     Temp 06/30/15 1922 98 F (36.7 C)     Temp Source 06/30/15 1922 Oral     SpO2 06/30/15 1922 93 %     Weight 06/30/15 1922 165 lb (74.844 kg)     Height 06/30/15 1922  (1.753 m)     Head Cir --      Peak Flow --      Pain Score 06/30/15 1923 10     Pain Loc  --      Pain Edu? --      Excl. in GC? --     Constitutional: Alert and oriented. Well appearing and in no distress. Eyes: Normal exam ENT   Head: Normocephalic and atraumatic.   Mouth/Throat: Mucous membranes are moist. Cardiovascular: Normal rate, regular rhythm. No murmur Respiratory: Normal respiratory effort without tachypnea nor retractions. Breath sounds are clear Gastrointestinal: Soft and nontender. No distention.   Musculoskeletal: Nontender with normal range of motion in all extremities. No lower extremity tenderness or edema. Neurologic:  Normal speech and language. No gross focal neurologic deficits  Skin:  Skin is warm, dry and intact.  Psychiatric: Mood and affect are normal. Speech and behavior are normal.   ____________________________________________    INITIAL IMPRESSION / ASSESSMENT AND PLAN / ED COURSE  Pertinent labs & imaging results that were available during my care of the patient were reviewed by me and considered in my medical decision making (see chart for details).  Patient presents with 3 days of right neck/right upper back pain. States somewhat worse with movement, worse when he pushes on the area and releases. The patient points to his lower right paraspinal muscles of the cervical spine, and into the top of his trapezius. No midline tenderness to palpation. No recent falls or trauma. Patient has oxycodone prescribed for pain, but does not take it. We will prescribe the patient Flexeril, and have him follow-up with his primary care physician for recheck. ____________________________________________   FINAL CLINICAL IMPRESSION(S) / ED DIAGNOSES  Cervical strain Musculoskeletal pain   Minna Antis, MD 06/30/15 2324

## 2015-06-30 NOTE — Discharge Instructions (Signed)
°  Please take your prescribed a muscle relaxer as needed for discomfort. Please return to the emergency department for any worsening pain, or any other symptom personally concerning to yourself. Please follow your primary care physician in the next 2-3 days for recheck/reevaluation.   Muscle Strain A muscle strain (pulled muscle) happens when a muscle is stretched beyond normal length. It happens when a sudden, violent force stretches your muscle too far. Usually, a few of the fibers in your muscle are torn. Muscle strain is common in athletes. Recovery usually takes 1-2 weeks. Complete healing takes 5-6 weeks.  HOME CARE   Follow the PRICE method of treatment to help your injury get better. Do this the first 2-3 days after the injury:  Protect. Protect the muscle to keep it from getting injured again.  Rest. Limit your activity and rest the injured body part.  Ice. Put ice in a plastic bag. Place a towel between your skin and the bag. Then, apply the ice and leave it on from 15-20 minutes each hour. After the third day, switch to moist heat packs.  Compression. Use a splint or elastic bandage on the injured area for comfort. Do not put it on too tightly.  Elevate. Keep the injured body part above the level of your heart.  Only take medicine as told by your doctor.  Warm up before doing exercise to prevent future muscle strains. GET HELP IF:   You have more pain or puffiness (swelling) in the injured area.  You feel numbness, tingling, or notice a loss of strength in the injured area. MAKE SURE YOU:   Understand these instructions.  Will watch your condition.  Will get help right away if you are not doing well or get worse.   This information is not intended to replace advice given to you by your health care provider. Make sure you discuss any questions you have with your health care provider.   Document Released: 03/31/2008 Document Revised: 04/12/2013 Document Reviewed:  01/19/2013 Elsevier Interactive Patient Education [employer]2016 [employer]vier Inc.

## 2015-06-30 NOTE — ED Notes (Signed)
Pt presents to ED with an "achy" pain to his neck with no known injury. States it feels like a muscle spasm. Hx of the same and has been evaluated "a long time ago" but does not remember what was wrong with his neck at that time. Was supposed to have an ultrasound on it last week but was rescheduled for January and pt states he can not wait that long before he is seen for his pain.

## 2016-01-18 IMAGING — CR DG HIP (WITH OR WITHOUT PELVIS) 2-3V*R*
1 series · 3 of 3 positions shown · non-contrast
Comparison: CT 10/15/2014

CLINICAL DATA: couldn't walk late last night and then he began
complaining of right side hip and leg pain today. Patient is deaf
and has trouble communicating. Best images possible

EXAM:
DG HIP (WITH OR WITHOUT PELVIS) 2-3V RIGHT

[Series 1: view not recorded · 0.14mm/px · 3 of 3 slices shown]
[im 1/3]
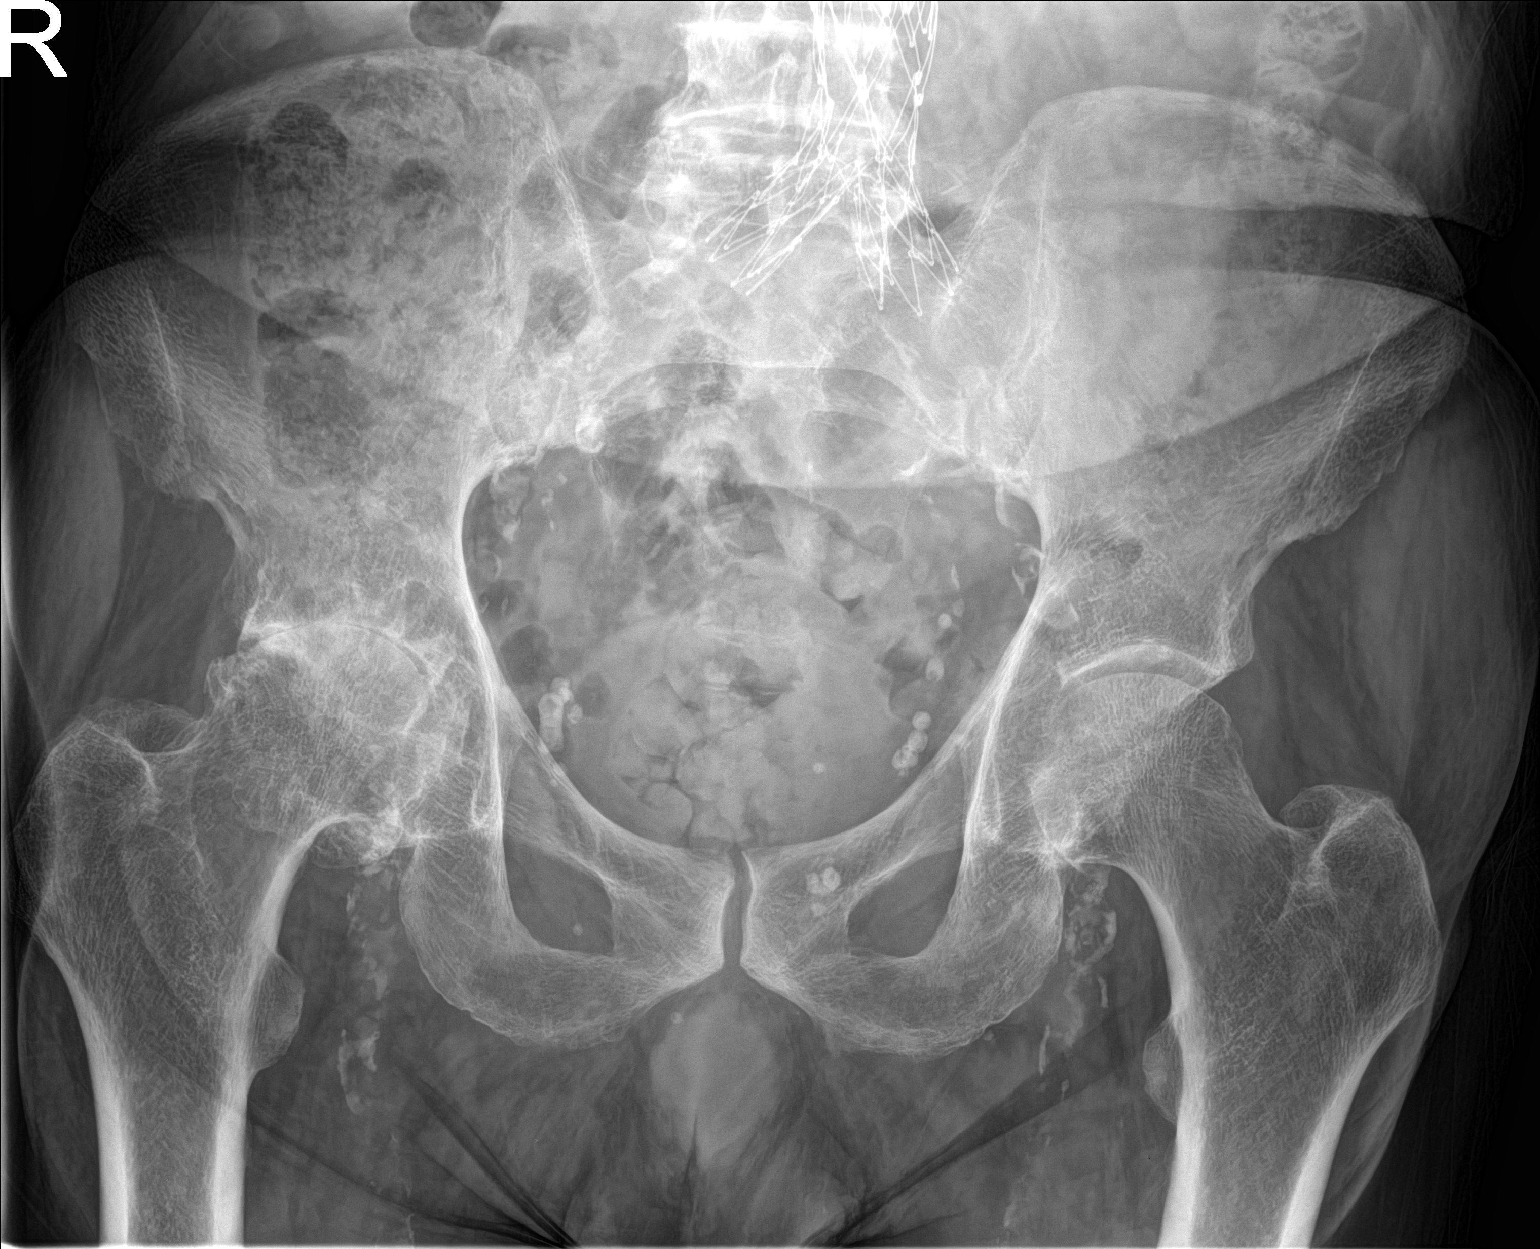
[im 2/3]
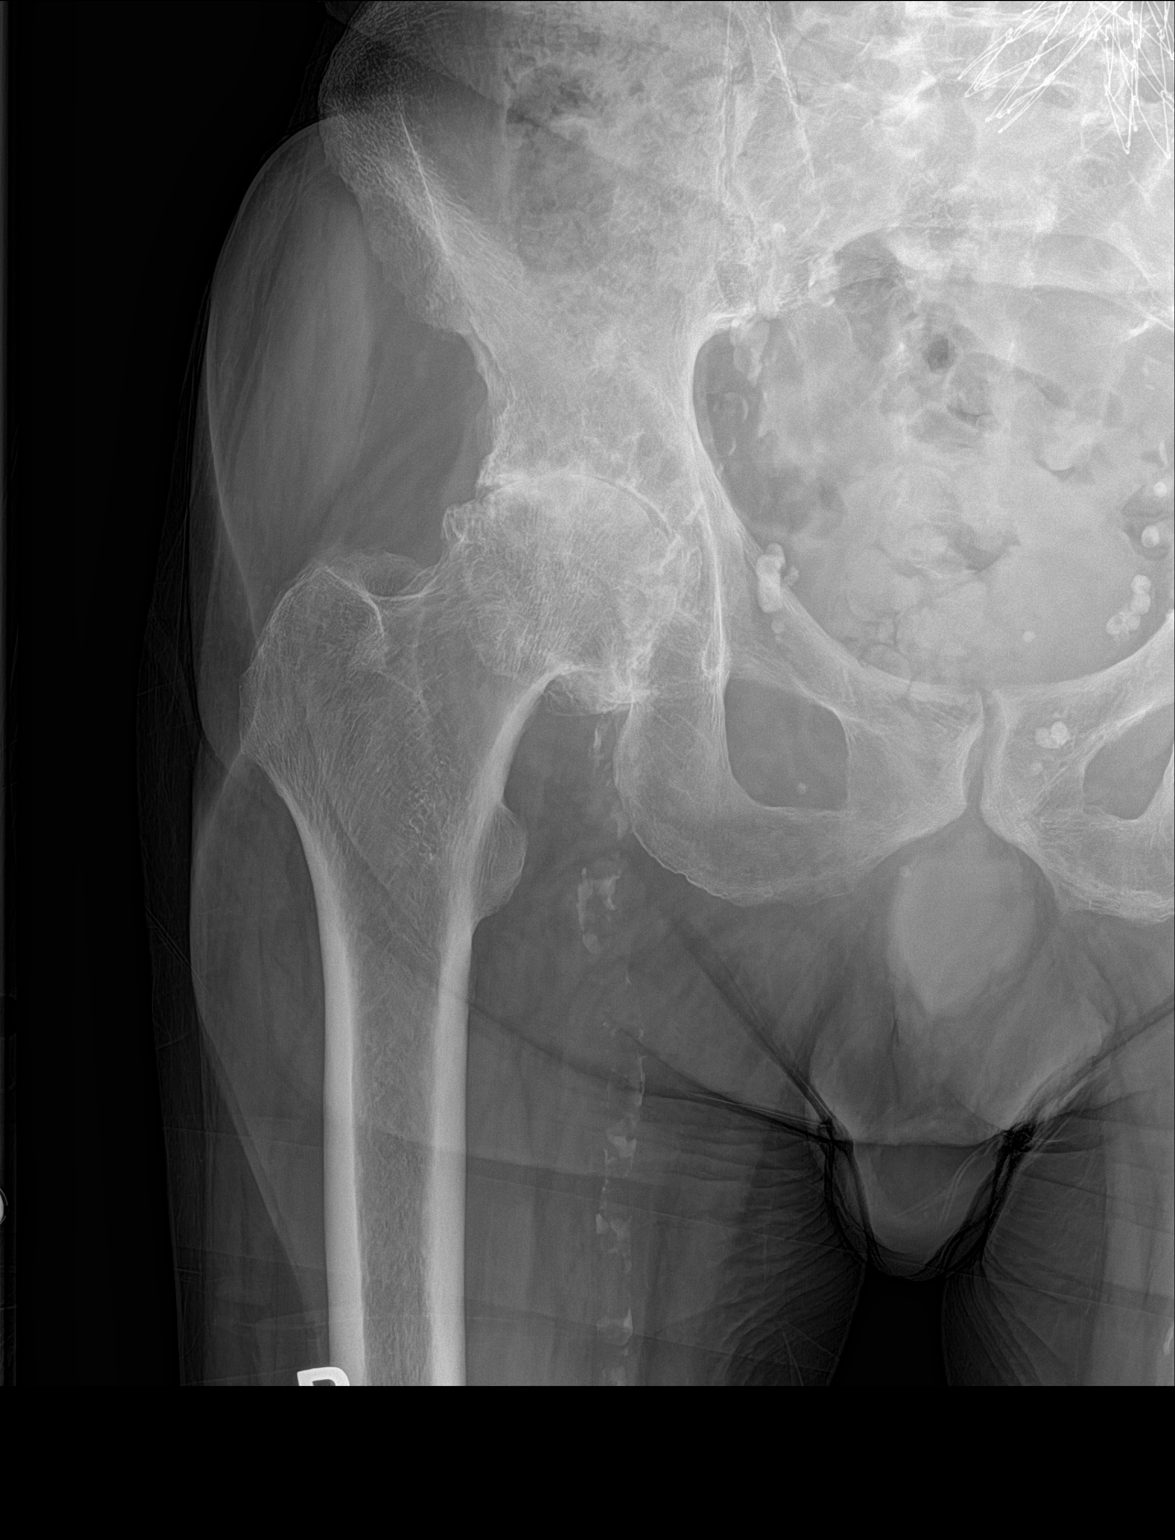
[im 3/3]
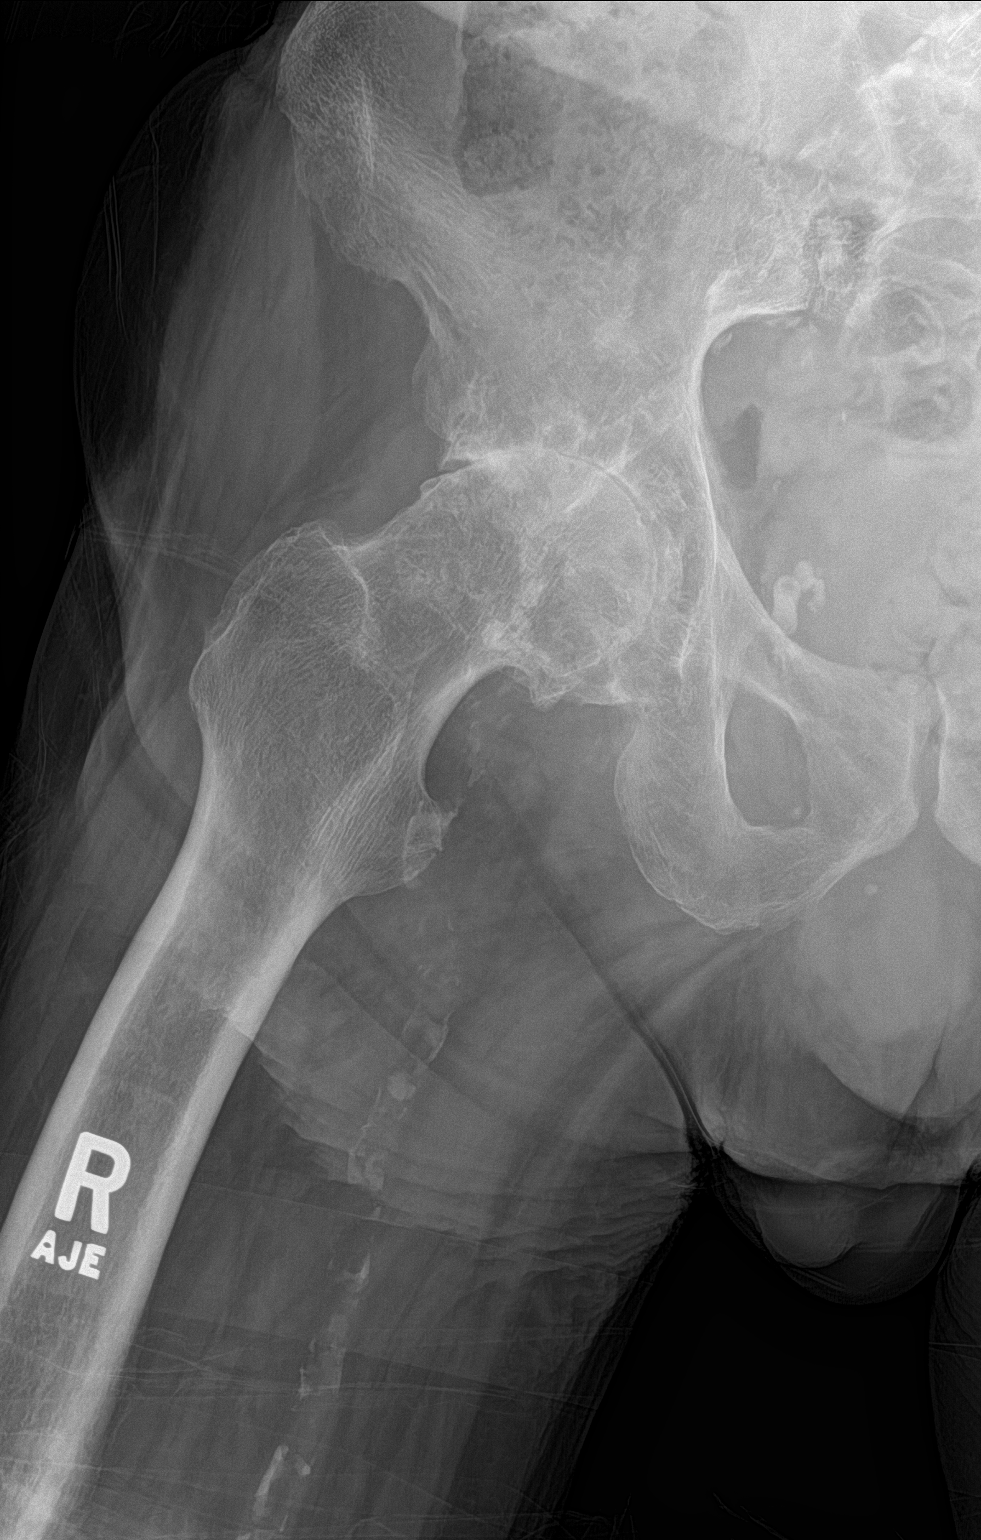

[3 of 3 positions shown; findings below may reference images not displayed]

FINDINGS: Narrowing of the articular cartilage in both hips, mild on the left,
advanced on the right with near bone on bone apposition, and
subchondral sclerosis and lucencies. Marginal spurs from the right
femoral head, with remodeling. Negative for fracture or dislocation.

Bilateral pelvic phleboliths. Patchy iliofemoral arterial
calcifications. An aortic stent graft is partially seen.
IMPRESSION: 1. Severe right hip arthritis without fracture, dislocation, or
other acute findings

## 2016-01-18 IMAGING — CT CT HIP*R* W/O CM
2 of 3 series · 17 of 46 positions shown, 19 images · non-contrast
Comparison: RIGHT hip radiographs 03/26/2015; comparison CT pelvis
08/24/2011

CLINICAL DATA: New onset RIGHT hip pain since yesterday, no fall,
negative radiographs

EXAM:
CT OF THE RIGHT HIP WITHOUT CONTRAST
TECHNIQUE: Multidetector CT imaging of the right hip was performed according to
the standard protocol. Multiplanar CT image reconstructions were
also generated.

[Series 4: hip soft tissue · axial · 0.42mm/px · z∈[-1048,-916]mm · 14 of 76 slices shown, 16 images]
[im 5/76  soft-tissue]
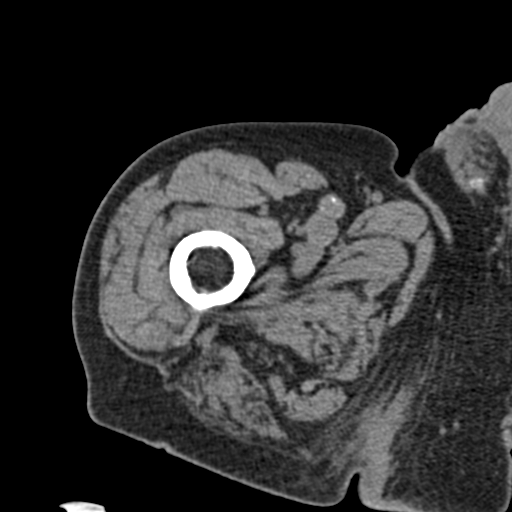
[im 5/76  bone]
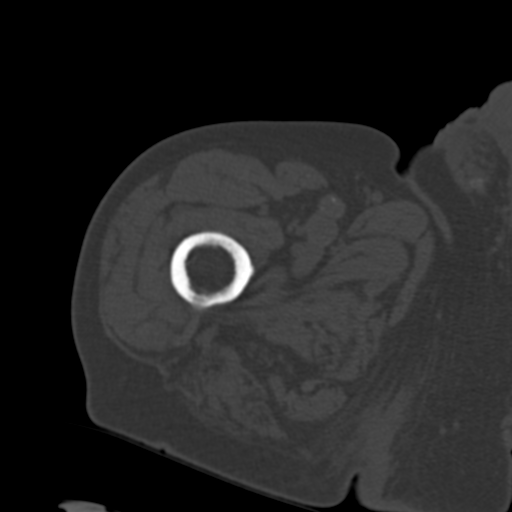
[im 10/76  soft-tissue]
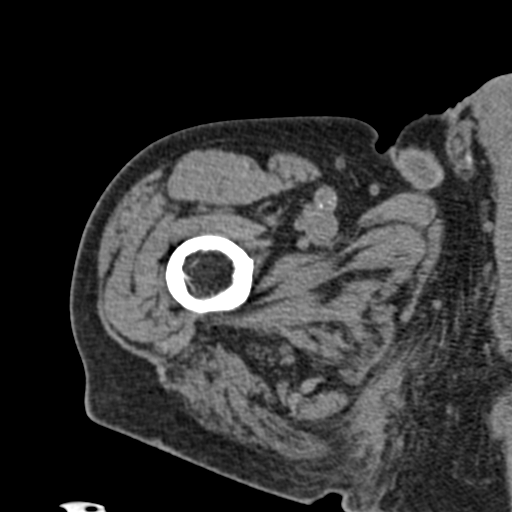
[im 15/76  soft-tissue]
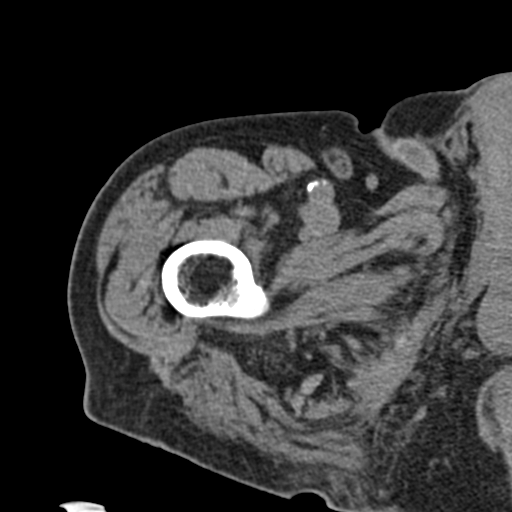
[im 20/76  soft-tissue]
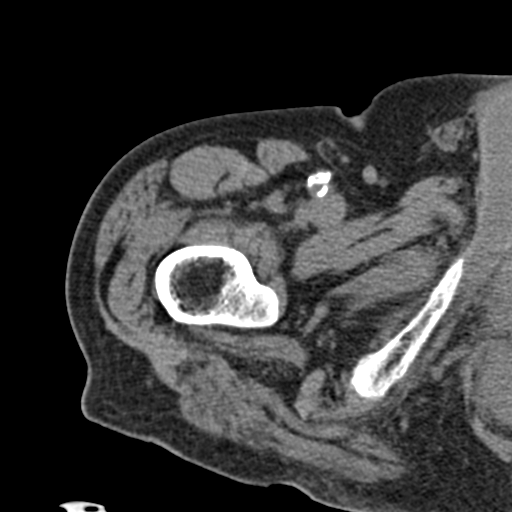
[im 25/76  soft-tissue]
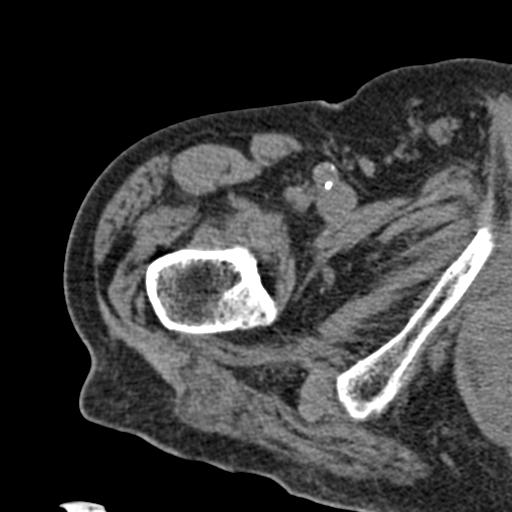
[im 30/76  soft-tissue]
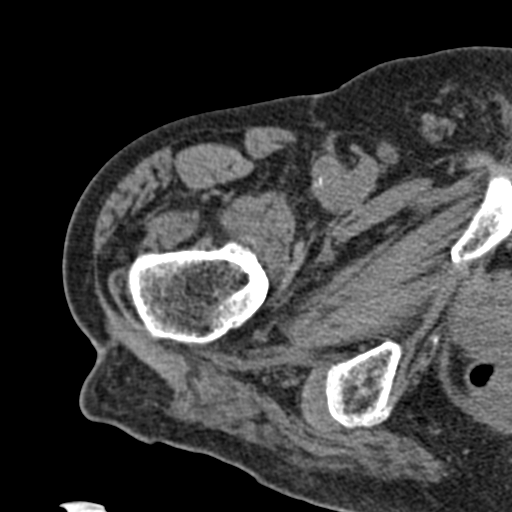
[im 34/76  soft-tissue]
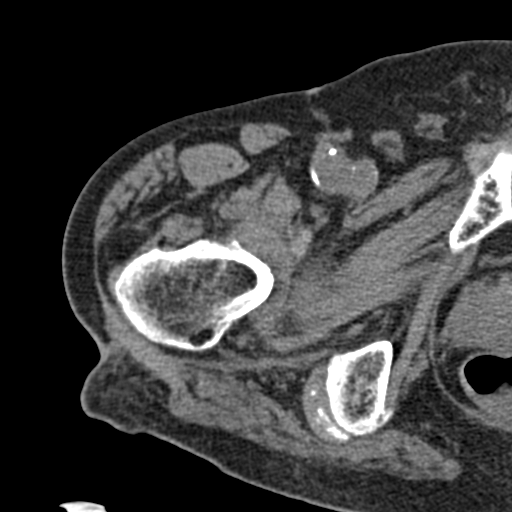
[im 42/76  soft-tissue]
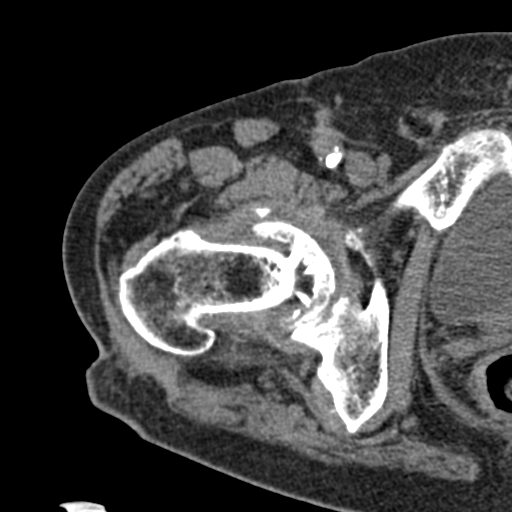
[im 46/76  soft-tissue]
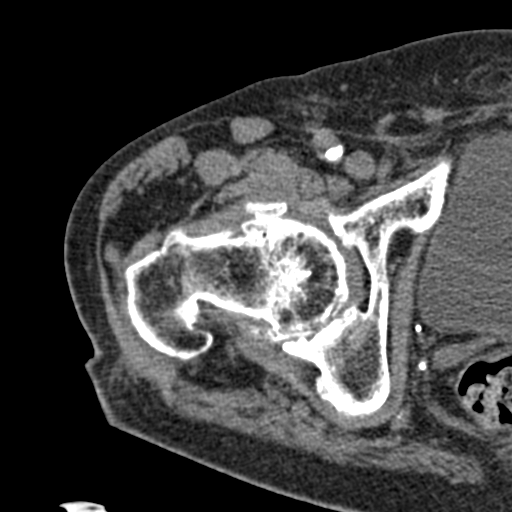
[im 46/76  bone]
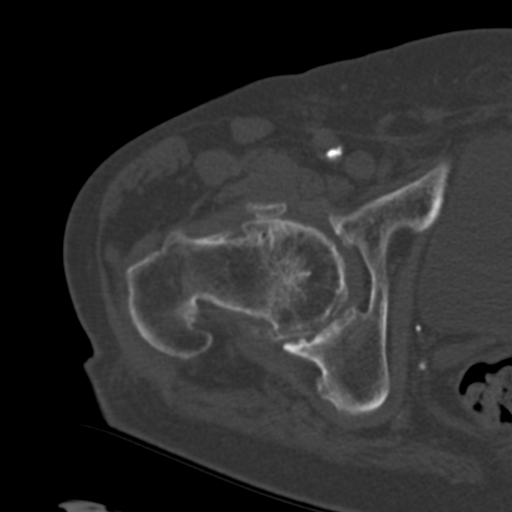
[im 51/76  soft-tissue]
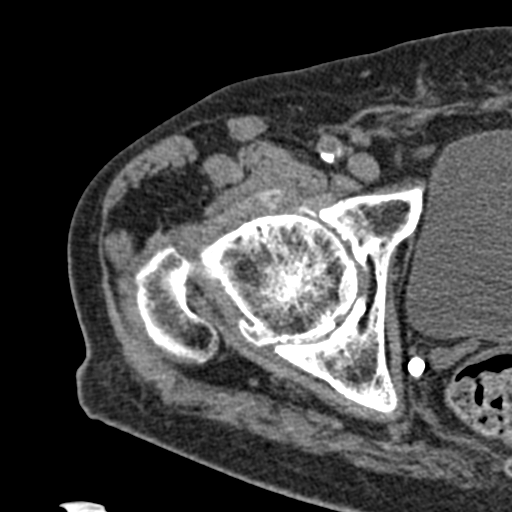
[im 56/76  soft-tissue]
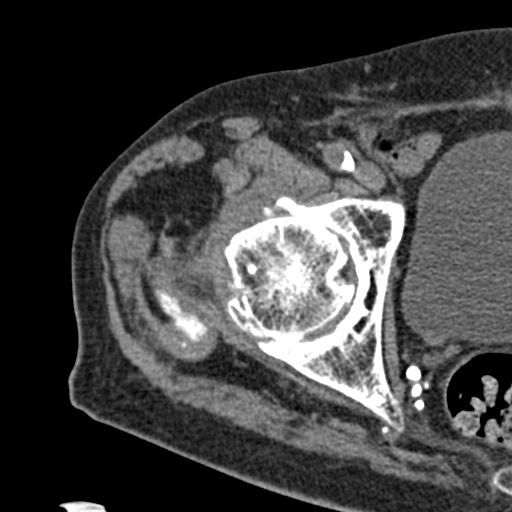
[im 61/76  soft-tissue]
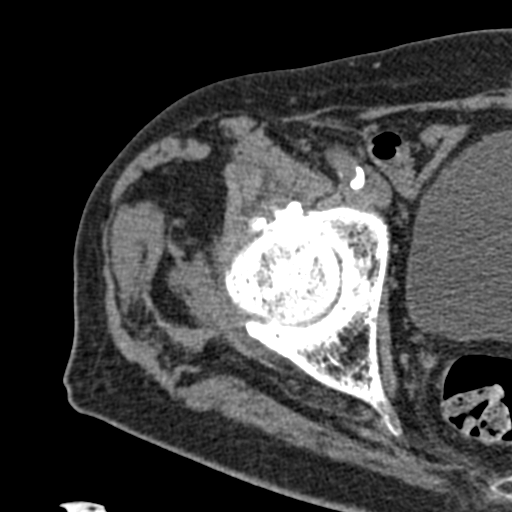
[im 66/76  soft-tissue]
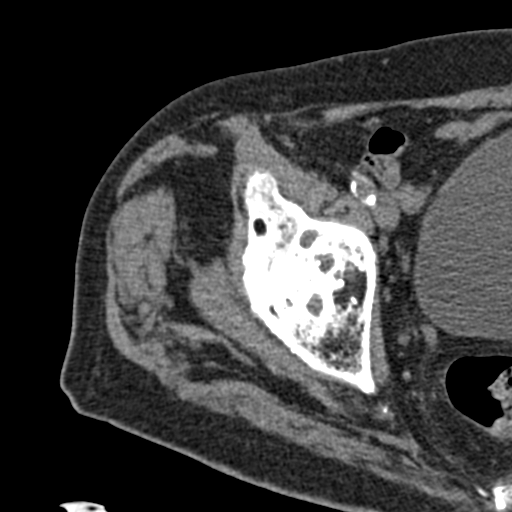
[im 71/76  soft-tissue]
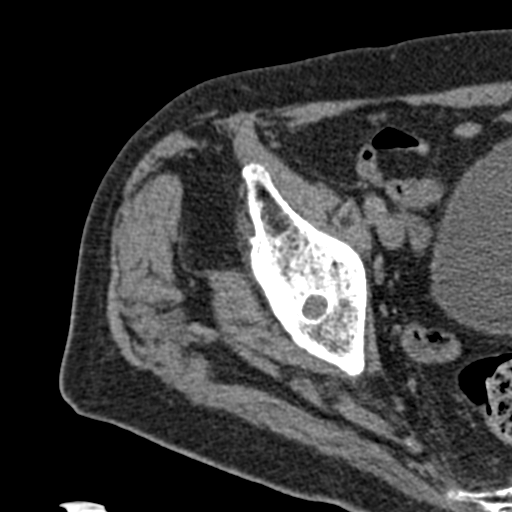

[Series 10: hip soft tissue cor- · coronal · 0.31mm/px · 3 of 75 slices shown]
[im 25/75  soft-tissue]
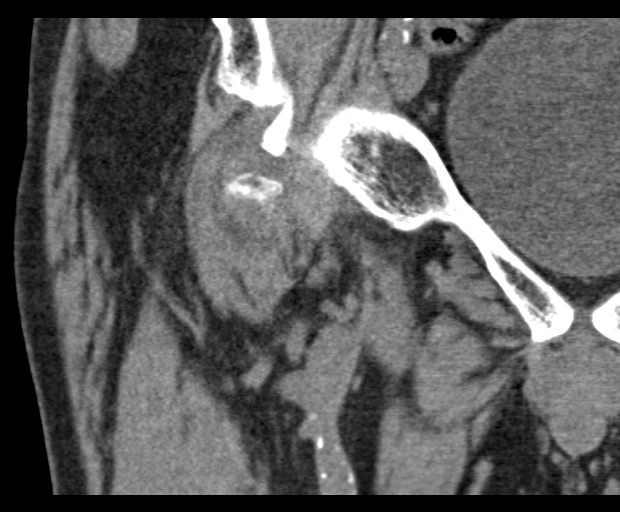
[im 33/75  soft-tissue]
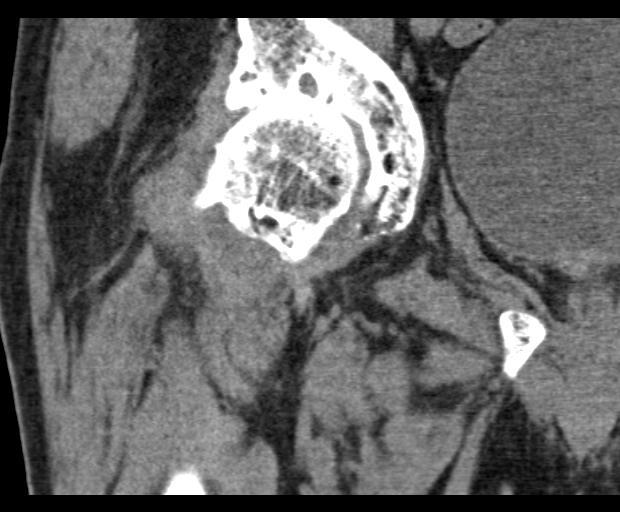
[im 42/75  soft-tissue]
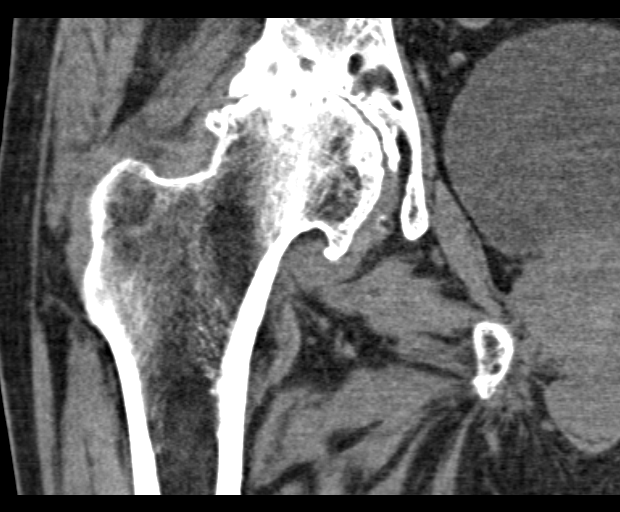

[17 of 46 positions shown; findings below may reference images not displayed]

FINDINGS: Bones appear demineralized.

Advanced osteoarthritic changes of the RIGHT hip joint with joint
space narrowing, spur formation and subchondral cyst formation.

Calcified body identified anteriorly measuring 18 x 8 x 17 mm.

Several small calcified loose bodies and minimal joint effusion are
also seen.

Bone-on-bone appearance superiorly.

No acute fracture, dislocation, or bone destruction.

Visualized RIGHT pelvis intact.

Scattered atherosclerotic calcifications.

No intrapelvic abnormalities identified.
IMPRESSION: Advanced osteoarthritic changes of the RIGHT hip joint with joint
space narrowing, spur formation, subchondral cyst formation, minimal
joint effusion, and calcified loose bodies.

No acute fracture or dislocation.

## 2016-07-13 ENCOUNTER — Other Ambulatory Visit (INDEPENDENT_AMBULATORY_CARE_PROVIDER_SITE_OTHER): Payer: Self-pay | Admitting: Vascular Surgery

## 2016-07-13 DIAGNOSIS — I714 Abdominal aortic aneurysm, without rupture, unspecified: Secondary | ICD-10-CM

## 2016-07-13 DIAGNOSIS — Z95828 Presence of other vascular implants and grafts: Secondary | ICD-10-CM

## 2016-07-13 DIAGNOSIS — I6523 Occlusion and stenosis of bilateral carotid arteries: Secondary | ICD-10-CM

## 2016-07-14 ENCOUNTER — Other Ambulatory Visit (INDEPENDENT_AMBULATORY_CARE_PROVIDER_SITE_OTHER): Payer: Medicare Other

## 2016-07-14 ENCOUNTER — Encounter (INDEPENDENT_AMBULATORY_CARE_PROVIDER_SITE_OTHER): Payer: Medicare Other

## 2016-07-14 ENCOUNTER — Ambulatory Visit (INDEPENDENT_AMBULATORY_CARE_PROVIDER_SITE_OTHER): Payer: Self-pay | Admitting: Vascular Surgery

## 2016-10-04 ENCOUNTER — Encounter: Admission: EM | Disposition: E | Payer: Self-pay | Source: Home / Self Care

## 2016-10-04 ENCOUNTER — Encounter: Payer: Self-pay | Admitting: Emergency Medicine

## 2016-10-04 ENCOUNTER — Emergency Department
Admission: EM | Admit: 2016-10-04 | Discharge: 2016-11-03 | Disposition: E | Payer: Medicare Other | Attending: Cardiology | Admitting: Cardiology

## 2016-10-04 DIAGNOSIS — Z79899 Other long term (current) drug therapy: Secondary | ICD-10-CM | POA: Diagnosis not present

## 2016-10-04 DIAGNOSIS — I129 Hypertensive chronic kidney disease with stage 1 through stage 4 chronic kidney disease, or unspecified chronic kidney disease: Secondary | ICD-10-CM | POA: Diagnosis not present

## 2016-10-04 DIAGNOSIS — I2510 Atherosclerotic heart disease of native coronary artery without angina pectoris: Secondary | ICD-10-CM | POA: Diagnosis not present

## 2016-10-04 DIAGNOSIS — I469 Cardiac arrest, cause unspecified: Secondary | ICD-10-CM | POA: Insufficient documentation

## 2016-10-04 DIAGNOSIS — Z951 Presence of aortocoronary bypass graft: Secondary | ICD-10-CM | POA: Insufficient documentation

## 2016-10-04 DIAGNOSIS — Z87891 Personal history of nicotine dependence: Secondary | ICD-10-CM | POA: Insufficient documentation

## 2016-10-04 DIAGNOSIS — Z7982 Long term (current) use of aspirin: Secondary | ICD-10-CM | POA: Diagnosis not present

## 2016-10-04 DIAGNOSIS — N184 Chronic kidney disease, stage 4 (severe): Secondary | ICD-10-CM | POA: Diagnosis not present

## 2016-10-04 LAB — COMPREHENSIVE METABOLIC PANEL
ALT: 62 U/L (ref 17–63)
AST: 88 U/L — AB (ref 15–41)
Albumin: 2.7 g/dL — ABNORMAL LOW (ref 3.5–5.0)
Alkaline Phosphatase: 73 U/L (ref 38–126)
Anion gap: 20 — ABNORMAL HIGH (ref 5–15)
BUN: 60 mg/dL — AB (ref 6–20)
CO2: 13 mmol/L — ABNORMAL LOW (ref 22–32)
CREATININE: 3.34 mg/dL — AB (ref 0.61–1.24)
Calcium: 8.4 mg/dL — ABNORMAL LOW (ref 8.9–10.3)
Chloride: 108 mmol/L (ref 101–111)
GFR calc Af Amer: 17 mL/min — ABNORMAL LOW (ref >60)
GFR, EST NON AFRICAN AMERICAN: 15 mL/min — AB (ref >60)
Glucose, Bld: 262 mg/dL — ABNORMAL HIGH (ref 65–99)
Potassium: 4.2 mmol/L (ref 3.5–5.1)
Sodium: 141 mmol/L (ref 135–145)
TOTAL PROTEIN: 5 g/dL — AB (ref 6.5–8.1)
Total Bilirubin: 1.3 mg/dL — ABNORMAL HIGH (ref 0.3–1.2)

## 2016-10-04 LAB — CBC WITH DIFFERENTIAL/PLATELET
Basophils Absolute: 0.1 10*3/uL (ref 0–0.1)
Basophils Relative: 1 %
EOS PCT: 3 %
Eosinophils Absolute: 0.4 10*3/uL (ref 0–0.7)
HCT: 33.9 % — ABNORMAL LOW (ref 40.0–52.0)
Hemoglobin: 10.8 g/dL — ABNORMAL LOW (ref 13.0–18.0)
Lymphocytes Relative: 41 %
Lymphs Abs: 5.4 10*3/uL — ABNORMAL HIGH (ref 1.0–3.6)
MCH: 30.4 pg (ref 26.0–34.0)
MCHC: 31.9 g/dL — ABNORMAL LOW (ref 32.0–36.0)
MCV: 95.1 fL (ref 80.0–100.0)
Monocytes Absolute: 2.1 10*3/uL — ABNORMAL HIGH (ref 0.2–1.0)
Monocytes Relative: 16 %
NEUTROS ABS: 5.1 10*3/uL (ref 1.4–6.5)
Neutrophils Relative %: 39 %
PLATELETS: 56 10*3/uL — AB (ref 150–440)
RBC: 3.56 MIL/uL — AB (ref 4.40–5.90)
RDW: 15.7 % — ABNORMAL HIGH (ref 11.5–14.5)
WBC: 13.1 10*3/uL — ABNORMAL HIGH (ref 3.8–10.6)

## 2016-10-04 LAB — TROPONIN I: Troponin I: 0.07 ng/mL (ref <0.03)

## 2016-10-04 SURGERY — LEFT HEART CATH AND CORONARY ANGIOGRAPHY
Anesthesia: Moderate Sedation

## 2016-10-04 MED ORDER — SODIUM BICARBONATE 8.4 % IV SOLN
INTRAVENOUS | Status: AC | PRN
  Administered 2016-10-04: 50 meq via INTRAVENOUS

## 2016-10-04 MED ORDER — NOREPINEPHRINE BITARTRATE 1 MG/ML IV SOLN
0.0000 ug/min | Freq: Once | INTRAVENOUS | Status: AC
  Administered 2016-10-04: 10 ug/min via INTRAVENOUS

## 2016-10-04 MED ORDER — LIDOCAINE HCL (PF) 1 % IJ SOLN
INTRAMUSCULAR | Status: AC
  Filled 2016-10-04: qty 30

## 2016-10-04 MED ORDER — EPINEPHRINE PF 1 MG/10ML IJ SOSY
PREFILLED_SYRINGE | INTRAMUSCULAR | Status: AC | PRN
  Administered 2016-10-04 (×9): 1 mg via INTRAVENOUS

## 2016-10-04 MED ORDER — ATROPINE SULFATE 1 MG/ML IJ SOLN
INTRAMUSCULAR | Status: AC | PRN
  Administered 2016-10-04: 1 mg via INTRAVENOUS

## 2016-10-04 MED ORDER — NOREPINEPHRINE BITARTRATE 1 MG/ML IV SOLN: 0.0000 ug/min | Freq: Once | INTRAVENOUS | Status: DC

## 2016-11-03 DIAGNOSIS — 419620001 Death: Secondary | SNOMED CT | POA: Insufficient documentation

## 2016-11-03 NOTE — Progress Notes (Signed)
CH responded to a PG for a Code Stemi which became an active CPR. Medical team were performing CPR on my arrival. [facility] notified daughter and awaited her arrival. [facility] escorted daughter to family waiting area. CH stayed with daughter as MD came for a consult. Pt expired. CH provided prayer outside of room. Pt services knew the daughter and provided additional care. CH is available for follow up as needed.    10/16/2016 1300  Clinical Encounter Type  Visited With Patient;Family;Health care provider  Visit Type Initial;Spiritual support;Code;Death;ED (Code Stemi)  Referral From Nurse  Spiritual Encounters  Spiritual Needs Prayer;Emotional;Grief support

## 2016-11-03 NOTE — ED Notes (Signed)
Chad [name], pt advocate spoke with pt dtr, Chad [name]. Pts wallet was found at patients house.

## 2016-11-03 NOTE — Code Documentation (Signed)
Pt started to brady down, pulses lost, CPR resumed.

## 2016-11-03 NOTE — ED Notes (Signed)
Pt daughter inquired about pts wallet and clothing. This RN informed pts dtr that pts clothes had to be cut off, no wallet was found in patient clothing.

## 2016-11-03 NOTE — Code Documentation (Signed)
CPR paused for pulse check, no pulses present  Dr. Darnelle Catalan and Dr. Cassie Freer at bedside

## 2016-11-03 NOTE — ED Notes (Addendum)
Pt brought in by [facility] from home, CPR in progress.  Per EMS they were called out reference back and chest pain, pt was found semi-responsive, per EMS when they got pt in the truck pt had a focal seizure, EKG showed STEMI, pt began to brady down into the 30's, pt was given atropine 1 mg and was paced. Pt then went into asystole and CPR was started, Epi 1 mg administered, King airway inserted.

## 2016-11-03 NOTE — ED Provider Notes (Addendum)
[facility] Emergency Department Provider Note   ____________________________________________   None    (approximate)  I have reviewed the triage vital signs and the nursing notes.   HISTORY  Chief Complaint No chief complaint on file.  Chief complaint CPR in progress  HPI Chad [name] is a 83 y.o. male who was at home got weak and collapsed EMS arrives found him as understood hypotensive, bradycardic  and then he lost his pulses he was found to have inferior elevation of about 1 mm in lead 2 and lead F by EMS EKG EMS began CPR when he lost his pulses give him 1 epi and one atropine in the field. He had a King airway in when he arrived in the emergency room getting CPR. He was in pulseless electrical activity. Emergency room he received a total of 1 atropine and 8 doses. He initially got his pulse back and was started on levo fed however he quickly lost his pulse more epinephrine was given 2 total of 8 as he was losing his pulse he became bradycardic which was when he got his atropine in the emergency room. CPR was resumed when he lost his pulses second time more epinephrine was given as I said that we were unable to get his pulse back. He received CPR for over half an hour in the emergency room. Dr. Darrold Junker is cardiologist was with [place] for most of the time. He knew the patient had seen him before. The prolonged down time and loss of cardiac activity on ultrasound that we stop CPR and we did. Time of death of leaflets 22-Nov-1231   Past Medical History:  Diagnosis Date  . Aortic aneurysm (HCC)   . Arrhythmia    Atrial Fib  . CHF (congestive heart failure) (HCC)   . Chronic kidney disease    stage 4  . Coronary artery disease   . Emphysema/COPD (HCC)   . Gout   . Hyperlipidemia   . Hypertension   . MRSA infection greater than 3 months ago   . Thrombocytopenia [facility])     Patient Active Problem List   Diagnosis Date Noted  . Chronic systolic heart failure  (HCC) 45/40/9811    Past Surgical History:  Procedure Laterality Date  . ABDOMINAL AORTIC ANEURYSM REPAIR    . APPENDECTOMY    . CAROTID ENDARTERECTOMY    . CORONARY ARTERY BYPASS GRAFT     x4  . HERNIA REPAIR      Prior to Admission medications   Medication Sig Start Date End Date Taking? Authorizing Provider  allopurinol (ZYLOPRIM) 100 MG tablet Take 100 mg by mouth daily.   Yes Historical Provider, MD  aspirin 81 MG tablet Take 81 mg by mouth daily.    Yes Historical Provider, MD  atorvastatin (LIPITOR) 20 MG tablet Take 20 mg by mouth daily.   Yes Historical Provider, MD  Cholecalciferol (VITAMIN D3) 2000 UNITS TABS Take 1 capsule by mouth daily.   Yes Historical Provider, MD  colchicine 0.6 MG tablet Take 0.6 mg by mouth 2 (two) times a week.   Yes Historical Provider, MD  gabapentin (NEURONTIN) 300 MG capsule Take 300 mg by mouth 3 (three) times daily.   Yes Historical Provider, MD  lactulose (CHRONULAC) 10 GM/15ML solution Take 10 g by mouth daily as needed for mild constipation.   Yes Historical Provider, MD  metoprolol (LOPRESSOR) 50 MG tablet Take 50 mg by mouth 2 (two) times daily.   Yes Historical  Provider, MD  torsemide (DEMADEX) 20 MG tablet Take 20 mg by mouth daily.   Yes Historical Provider, MD  ALPRAZolam (XANAX) 0.25 MG tablet Take 0.25 mg by mouth at bedtime.    Historical Provider, MD    Allergies Vicodin [hydrocodone-acetaminophen]  Family History  Problem Relation Age of Onset  . Stroke Father     Social History Social History  Substance Use Topics  . Smoking status: Former Smoker    Packs/day: 1.00    Years: 0.00    Types: Cigarettes    Quit date: 10/09/2006  . Smokeless tobacco: Never Used  . Alcohol use No    Review of Systems unAble to obtain due to CPR ____________________________________________   PHYSICAL EXAM:  VITAL SIGNS: ED Triage Vitals  Enc Vitals Group     BP 2016/10/10 1211 (!) 106/43     Pulse Rate Oct 10, 2016 1216 (!) 129      Resp 10-Oct-2016 1216 16     Temp --      Temp src --      SpO2 October 10, 2016 1212 (!) 87 %     Weight --      Height --      Head Circumference --      Peak Flow --      Pain Score --      Pain Loc --      Pain Edu? --      Excl. in GC? --     Constitutional: Pale and unresponsive Eyes: Conjunctivae are normal. PERRL. EOMI. Head: Atraumatic. Nose: No congestion/rhinnorhea. Mouth/Throat: Mucous membranes are moist.  Oropharynx non-erythematous in fact pale. Neck intubated Cardiovascular: No pulse. Respiratory: No respiratory effort after intubation good and equal bilateral breath sounds. Patient has a bruise on the left side of the chest Gastrointestinal: .  distention. No abdominal bruits.  Musculoskeletal: N edema.  No joint effusions. .  ____________________________________________   LABS (all labs ordered are listed, but only abnormal results are displayed)  Labs Reviewed  CBC WITH DIFFERENTIAL/PLATELET  TROPONIN I  COMPREHENSIVE METABOLIC PANEL   ____________________________________________  EKG  Initial EKG showed ST elevation in 2 and F this was EKG obtained by EMS. Subsequent EKGs were obtained and read by Dr. Darrold Junker ____________________________________________  RADIOLOGY  ____________________________________________   PROCEDURES  Procedure(s) performed: Brooke Dare LT looked like it was in good position however no breath sounds were audible with it in position therefore was removed patient was intubated under direct vision with a 7-1/2 ET tube cords were visualized and tube was inserted easily 21 at the lips. After that good bilateral breath sounds were present with no breath sounds in the stomach and good color change on the colorimetric device  Procedures  Critical Care performed:  Critical care time 15 minutes this makes the total time approximately 40 minutes working on the patient ____________________________________________   INITIAL IMPRESSION /  ASSESSMENT AND PLAN / ED COURSE  Pertinent labs & imaging results that were available during my care of the patient were reviewed by me and considered in my medical decision making (see chart for details).        ____________________________________________   FINAL CLINICAL IMPRESSION(S) / ED DIAGNOSES  Final diagnoses:  Cardiac arrest (HCC)      NEW MEDICATIONS STARTED DURING THIS VISIT:  New Prescriptions   No medications on file     Note:  This document was prepared using Dragon voice recognition software and may include unintentional dictation errors.    Arnaldo Natal,  MD 10/23/2016 1251    Arnaldo Natal, MD 10/22/2016 [phone]

## 2016-11-03 NOTE — Code Documentation (Signed)
CPR paused for pulse check, pulses present

## 2016-11-03 NOTE — ED Notes (Signed)
Assisted with CPR and clean up.

## 2016-11-03 NOTE — ED Notes (Signed)
Paige from [facility] called back and stated that they would like pt held for possible tissue donation.   Elnita Maxwell, RN supervision notified of this at [phone]

## 2016-11-03 NOTE — Code Documentation (Addendum)
Pulses present at this time.  

## 2016-11-03 NOTE — ED Triage Notes (Signed)
Pt arrived via EMS CPR in progress epi 1 mg given in field

## 2016-11-03 NOTE — ED Notes (Signed)
Per Dr. Darnelle Catalan, pt is not going to be Medical Examiners case, OK to pull lines and airway

## 2016-11-03 NOTE — Code Documentation (Signed)
Patient time of death occurred at 50

## 2016-11-03 NOTE — Code Documentation (Signed)
CPR paused for pulse check, no pulses present

## 2016-11-03 NOTE — Code Documentation (Signed)
Pulses checked, no pulses present, unable to obtain blood pressure at this time

## 2016-11-03 NOTE — Code Documentation (Signed)
CPR in progress. 

## 2016-11-03 DEATH — deceased
